# Patient Record
Sex: Female | Born: 1940
Health system: Southern US, Community
[De-identification: ages and names within clinical notes are randomized; demographics above are authoritative.]

## PROBLEM LIST (undated history)

## (undated) DIAGNOSIS — I10 Essential (primary) hypertension: Secondary | ICD-10-CM

## (undated) DIAGNOSIS — E119 Type 2 diabetes mellitus without complications: Secondary | ICD-10-CM

## (undated) DIAGNOSIS — J449 Chronic obstructive pulmonary disease, unspecified: Secondary | ICD-10-CM

## (undated) HISTORY — PX: TONSILLECTOMY: SUR1361

## (undated) HISTORY — DX: Essential (primary) hypertension: I10

## (undated) HISTORY — DX: Type 2 diabetes mellitus without complications: E11.9

## (undated) HISTORY — PX: HERNIA REPAIR: SHX51

## (undated) HISTORY — PX: ABDOMINAL HYSTERECTOMY: SHX81

## (undated) HISTORY — DX: Chronic obstructive pulmonary disease, unspecified: J44.9

---

## 1997-12-03 ENCOUNTER — Encounter: Payer: Self-pay | Admitting: Family Medicine

## 1997-12-03 ENCOUNTER — Ambulatory Visit (HOSPITAL_COMMUNITY): Admission: RE | Admit: 1997-12-03 | Discharge: 1997-12-03 | Payer: Self-pay | Admitting: Family Medicine

## 2000-01-04 ENCOUNTER — Encounter: Payer: Self-pay | Admitting: Family Medicine

## 2000-01-04 ENCOUNTER — Encounter: Admission: RE | Admit: 2000-01-04 | Discharge: 2000-01-04 | Payer: Self-pay | Admitting: Family Medicine

## 2000-01-16 ENCOUNTER — Encounter: Payer: Self-pay | Admitting: Family Medicine

## 2000-01-16 ENCOUNTER — Ambulatory Visit (HOSPITAL_COMMUNITY): Admission: RE | Admit: 2000-01-16 | Discharge: 2000-01-16 | Payer: Self-pay | Admitting: Family Medicine

## 2000-01-22 ENCOUNTER — Encounter: Payer: Self-pay | Admitting: Family Medicine

## 2000-01-22 ENCOUNTER — Encounter: Admission: RE | Admit: 2000-01-22 | Discharge: 2000-01-22 | Payer: Self-pay | Admitting: Family Medicine

## 2000-03-05 ENCOUNTER — Encounter: Admission: RE | Admit: 2000-03-05 | Discharge: 2000-06-03 | Payer: Self-pay | Admitting: Family Medicine

## 2000-03-19 ENCOUNTER — Encounter: Payer: Self-pay | Admitting: Gynecology

## 2000-03-20 ENCOUNTER — Encounter (INDEPENDENT_AMBULATORY_CARE_PROVIDER_SITE_OTHER): Payer: Self-pay | Admitting: Specialist

## 2000-03-20 ENCOUNTER — Observation Stay (HOSPITAL_COMMUNITY): Admission: RE | Admit: 2000-03-20 | Discharge: 2000-03-21 | Payer: Self-pay | Admitting: Gynecology

## 2002-01-27 ENCOUNTER — Other Ambulatory Visit: Admission: RE | Admit: 2002-01-27 | Discharge: 2002-01-27 | Payer: Self-pay | Admitting: Gynecology

## 2002-10-27 ENCOUNTER — Ambulatory Visit (HOSPITAL_COMMUNITY): Admission: RE | Admit: 2002-10-27 | Discharge: 2002-10-27 | Payer: Self-pay | Admitting: Orthopedic Surgery

## 2002-10-27 ENCOUNTER — Encounter: Payer: Self-pay | Admitting: Orthopedic Surgery

## 2002-11-19 ENCOUNTER — Ambulatory Visit (HOSPITAL_COMMUNITY): Admission: RE | Admit: 2002-11-19 | Discharge: 2002-11-19 | Payer: Self-pay | Admitting: Orthopedic Surgery

## 2002-11-19 ENCOUNTER — Ambulatory Visit (HOSPITAL_BASED_OUTPATIENT_CLINIC_OR_DEPARTMENT_OTHER): Admission: RE | Admit: 2002-11-19 | Discharge: 2002-11-19 | Payer: Self-pay | Admitting: Orthopedic Surgery

## 2003-05-24 ENCOUNTER — Ambulatory Visit (HOSPITAL_COMMUNITY): Admission: RE | Admit: 2003-05-24 | Discharge: 2003-05-24 | Payer: Self-pay | Admitting: Family Medicine

## 2009-06-10 ENCOUNTER — Inpatient Hospital Stay (HOSPITAL_COMMUNITY): Admission: RE | Admit: 2009-06-10 | Discharge: 2009-06-11 | Payer: Self-pay | Admitting: Neurosurgery

## 2009-08-04 ENCOUNTER — Encounter: Admission: RE | Admit: 2009-08-04 | Discharge: 2009-08-04 | Payer: Self-pay | Admitting: Neurosurgery

## 2010-03-27 LAB — CBC
HCT: 34.4 % — ABNORMAL LOW (ref 36.0–46.0)
RBC: 4.48 MIL/uL (ref 3.87–5.11)
RDW: 17.1 % — ABNORMAL HIGH (ref 11.5–15.5)
WBC: 7.3 10*3/uL (ref 4.0–10.5)

## 2010-03-27 LAB — TYPE AND SCREEN
ABO/RH(D): A POS
Antibody Screen: NEGATIVE

## 2010-03-27 LAB — DIFFERENTIAL
Basophils Absolute: 0 10*3/uL (ref 0.0–0.1)
Eosinophils Absolute: 0.1 10*3/uL (ref 0.0–0.7)
Eosinophils Relative: 1 % (ref 0–5)
Lymphs Abs: 1.7 10*3/uL (ref 0.7–4.0)
Monocytes Absolute: 0.5 10*3/uL (ref 0.1–1.0)
Monocytes Relative: 8 % (ref 3–12)
Neutro Abs: 4.9 10*3/uL (ref 1.7–7.7)
Neutrophils Relative %: 67 % (ref 43–77)

## 2010-03-27 LAB — ABO/RH: ABO/RH(D): A POS

## 2010-03-27 LAB — GLUCOSE, CAPILLARY: Glucose-Capillary: 228 mg/dL — ABNORMAL HIGH (ref 70–99)

## 2010-05-26 NOTE — Op Note (Signed)
NAME:  Maria Clay, Maria Clay                   ACCOUNT NO.:  [id number]   MEDICAL RECORD NO.:  [id number]                   PATIENT TYPE:  AMB   LOCATION:  DSC                                  FACILITY:  [facility]   PHYSICIAN:  Katy Fitch. Naaman Plummer., M.D.          DATE OF BIRTH:  07-May-1940   DATE OF PROCEDURE:  11/19/2002  DATE OF DISCHARGE:                                 OPERATIVE REPORT   PREOPERATIVE DIAGNOSIS:  Chronic ulnar sided pain left wrist with clinical  signs suggesting a peripheral triangle of fibrocartilage tear, plane films  demonstrating a loose body adjacent to the LT ligament and MRI evidence of  ulnar sided chondromalacia of lunate with complex pathology of the triangle  of fibrocartilage complex including a radial side degenerative tear and an  extensive degenerative tear of the peripheral triangle of fibrocartilage  with synovitis.   POSTOPERATIVE DIAGNOSIS:  Chronic ulnar sided pain left wrist with clinical  signs suggesting a peripheral triangle of fibrocartilage tear, plane films  demonstrating a loose body adjacent to the LT ligament and MRI evidence of  ulnar sided chondromalacia of lunate with complex pathology of the triangle  of fibrocartilage complex including a radial side degenerative tear and an  extensive degenerative tear of the peripheral triangle of fibrocartilage  with synovitis.   OPERATION:  1. Diagnostic arthroscopy left wrist.  2. Arthroscopic debridement of degenerative radial side triangle of     fibrocartilage tear and extensive debridement of degenerative peripheral     triangle of fibrocartilage tear with attempted outside in repair of     peripheral tear which was technically not feasible due to the extensive     degree of degeneration.  Ultimately, we performed an extensive     debridement of chondromalacia of the lunate, removal of loose bodies and     triangle of fibrocartilage debridement with extensive synovectomy.   SURGEON:   Katy Fitch. Sypher, M.D.   ASSISTANT:  Jonni Sanger, P.A.   ANESTHESIA:  General by LMA.   ANESTHESIOLOGIST:  Sheldon Silvan, M.D.   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 Nuala Alpha for evaluation and  management of a painful left wrist.  She had had pain in the wrist since  carrying some heavy bags of yard material in June 2004.  Clinical  examination suggested peripheral triangle of fibrocartilage pathology.  Plane films demonstrated that she was ulnar minus by 1 mm.  She had a small  radio-opaque loose body adjacent to the LT ligament and clinical signs on  examination of a peripheral triangle of fibrocartilage tear.  An  MRI of her  wrist was obtained which documented two tears of the triangle of  fibrocartilage, one a central degenerative tear, the second a peripheral  possibly traumatic tear.  Due to her failure to respond to rest and anti-  inflammatory medications, we recommended diagnostic arthroscopy at this  time.  Given the nature of her degeneration of the triangle of  fibrocartilage, we were uncertain whether or not a repair would be possible.  We recommended debridement at this time with possible repair and possible  ulnar shortening.   PROCEDURE:  Maria Clay is brought to the operating room and placed on  supine position on the operating table.  Following induction of general  anesthesia by LMA, the left arm was prepped with Betadine solution and  sterilely draped.  Following exsanguination of the limb with an Esmarch  bandage, an arterial tourniquet on the proximal brachium was inflated to 220  mmHg.  The procedure commenced with distraction of the wrist with a Tower  designed for wrist arthroscopy.  Finger traps were applied to the index and  long fingers, counter traction on the forearm, 10 pounds of distraction  applied.   The scope was introduced with blunt technique through a  standard portal  after distention of the wrist with sterile saline through a 20 gauge needle.  Diagnostic arthroscopy revealed intact articular surfaces on the scaphoid,  lunate, and triquetrum from the 3/4 view.  There was considerable synovitis  at the site of the peripheral triangle of fibrocartilage, an extensive  degenerative tear with synovitis extruding through the triangle of  fibrocartilage in the ulnar aspect of the dorsal periphery and a small  degenerative radial tear.  A 6R portal was created and suction shaver was  used to debride synovitis and facilitate exposure of the triangle of  fibrocartilage.  The scope was then switched to the 6R portal and an area of  grade 3 chondromalacia on the ulnar aspect of the lunate and degenerative  changes within the L2 ligament were documented.  The radiographic loose body  was not identified, however, the margins of the articular hyaline cartilage  on the lunate were debrided thinking that perhaps the calcifications seen on  x-ray was calcified hyaline cartilage.   The scope was positioned in the 6R portal and a 4/5 portal was used to  further debride the most palmar surface of the triangle of fibrocartilage.  The degenerative tear was smooth.  The peripheral tear was not amenable to  inside out repair with a Tuohy needle.  Therefore, we attempted an outside  in repair utilizing a suture shuttle.  While this was accomplished with some  facility, the angle of the suture placement, in my judgment, would not  satisfactorily re-establish tension in the triangle of fibrocartilage.  Therefore, after several tries, I deemed this technically not satisfactory  and abandoned this procedure.  The degenerative portion of the triangle of  fibrocartilage was thoroughly debrided followed by irrigation of the joint  and removal of the arthroscopic equipment.  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  This is not classic ulnar carpal abutment in  that [name] is not ulnar plus.  In my judgment, thorough debridement  and a period of splinting in supination should allow healing of the triangle  of fibrocartilage to a degree in the peripheral region.   There were no apparent complications.  The portals were closed with Steri-  Strips followed by application of a gauze dressing and a sugar tong splint  maintaining Ms. Pabst's wrist in full supination.  Katy Fitch Naaman Plummer., M.D.    RVS/MEDQ  D:  11/19/2002  T:  11/19/2002  Job:  [id number]   cc:   Nuala Alpha, M.D.

## 2010-05-26 NOTE — Discharge Summary (Signed)
[place]. [facility]  Patient:    Maria Clay, Maria Clay                MRN: [id number] Adm. Date:  [id number] Attending:  Susa Raring                           Discharge Summary  PREOPERATIVE DIAGNOSIS:   Left adnexal mass.  POSTOPERATIVE DIAGNOSIS:  Benign cystic mass of the left ovary.  OPERATION:  Laparoscopic bilateral salpingo-oophorectomy.  SURGEON:  Luvenia Redden, M.D.  ASSISTANT:  Marcelle Overlie, M.D.  ANESTHESIA:  General endotracheal.  DESCRIPTION OF PROCEDURE:   Under good anesthesia, the patient was prepped and draped in a sterile manner.  Foley catheter was placed in the bladder.  Sponge stick was placed in the vagina to identify the upper limits of the vaginal apex.  Transverse incision was made at the lower border of the umbilicus.  The Verres needle was inserted intraperitoneally and pneumoperitoneum formed using carbon dioxide.  Proper placement of the needle was verified by negative pressure reading on the mammometer with upward traction of the abdominal wall before the CO2 was insufflated.  Once the pneumoperitoneum had been established, the needle was removed.  Laparoscopic trocar and cannula was introduced.  The trocar was removed, and the laparoscope introduced.  Under direct vision, puncture sites were identified in both lower quadrants, taking care to miss the major vessels.  A 5 mm trocar and cannula were introduced into the right and left lower quadrants.  The trocar was removed and instruments were inserted through these cannulae.  The bowel was mobilized out of the pelvis under the upper abdomen so that the pelvic organs could be viewed.  The pelvic peritoneum was smooth and shiny.  The right tube and ovary appeared to be normal.  It was freely movable in the pelvis.  The appendix on the right side was normal.  The sigmoid was mobilized into the midline.  The left ovary was enlarged approximately 3-4 cm.  It was  freely movable in the pelvis.  However, there were several excrescences present on the surface of the ovary on its medial surface that were a bit worrisome.  The pelvic peritoneum was smooth and shiny and there was no evidence of any implants. Saline was instilled to collect pelvic washings and these were saved.  The left ovary was attended to first.  The ovary was grasped with a grasper and put on a stretcher.  The infundibulopelvic ligament could be identified readily.  The ureter was identified and was well below the operative site. The infundibulopelvic ligament ligament was grasped with the Seitzinger forceps and was completely desiccated with electrocoagulation and then incised.  Electrocoagulation in the incision was carried down along the ovary separating it from the mesentery down to its caudal portion which was just at the left of the apex of the vagina.  We were able to successfully remove the ovary from its loins without rupturing the cyst.  The ovary and tube on the left side then was placed in a endobag which was introduced through a 10-12 trocar that had been inserted in the midline suprapubically.  The cannula was removed and the fascial incision was extended slightly so that the bag containing the ovary could be removed intact from the peritoneal cavity.  The specimen was sent to pathology for frozen section and examination, and it was reported by Dr. Guilford Shi as being benign cyst  of the left ovary.  The cannula was replaced in the midline incision, and the skin was secured tightly around it with Allis clamps so that the peritoneal cavity could be reinsufflated.  Attention was then directed to the right tube and ovary.  The ovary and tube was grasped with a grasper and the infundibulopelvic ligament put on tension so as to outline it nicely.  The ureter on the right side was viewed beginning at the pelvic brim and all the way down through the pelvis and was well out of  the operative area.  The infundibulopelvic ligament on the right side was then desiccated with electrocoagulation using the Seitzinger forceps and the it was divided.  Dissection was carried out with the coagulating the mesentery and incising it until the ovary had been completely freed up from the pelvis. This ovary was quite small and was removed by grasping it with a single tooth forceps and retracting it up through the 12 mm cannula.  The area was irrigated with saline and this was aspirated.  Operative areas were inspected both under insufflation and under reduced pressure and there was no bleeding at all from the operative sites.  Under the reduce pressure, then the cannula were removed and the puncture sites were viewed internally through the scope.  There was no active bleeding from the puncture sites.  The pneumoperitoneum was completely relieved as much as possible and the scope was then retracted slowly.  There was no bleeding in the subumbilical puncture site either.  The suprapubic incision in the midline was then closed by using a 0 Vicryl continuous suture to close the fascia separately.  Subcutaneous tissue was approximated with a single Vicryl suture, and then the skin was closed with subcuticular 2-0 plain catgut.  The 5 mm puncture sites were closed with a single 2-0 plain catgut suture.  The subumbilical incision was also closed with subcuticular 2-0 plain catgut.  Band-aids were applied.  The Foley catheter was removed as well as removing the sponge sticks from the vagina.  All counts were correct before leaving the operating room.  The patient tolerated the procedure well and was removed to recovery in good condition. DD:  03/20/00 TD:  03/20/00 Job: [id number]

## 2011-07-27 DIAGNOSIS — I493 Ventricular premature depolarization: Secondary | ICD-10-CM

## 2011-07-27 DIAGNOSIS — I10 Essential (primary) hypertension: Secondary | ICD-10-CM

## 2011-07-27 DIAGNOSIS — E782 Mixed hyperlipidemia: Secondary | ICD-10-CM | POA: Insufficient documentation

## 2011-07-27 HISTORY — DX: Mixed hyperlipidemia: E78.2

## 2011-07-27 HISTORY — DX: Essential (primary) hypertension: I10

## 2011-07-27 HISTORY — DX: Ventricular premature depolarization: I49.3

## 2013-07-18 DIAGNOSIS — K589 Irritable bowel syndrome without diarrhea: Secondary | ICD-10-CM | POA: Insufficient documentation

## 2013-07-18 DIAGNOSIS — M1710 Unilateral primary osteoarthritis, unspecified knee: Secondary | ICD-10-CM | POA: Insufficient documentation

## 2013-07-18 DIAGNOSIS — R5383 Other fatigue: Secondary | ICD-10-CM | POA: Insufficient documentation

## 2013-07-18 DIAGNOSIS — K222 Esophageal obstruction: Secondary | ICD-10-CM

## 2013-07-18 DIAGNOSIS — K257 Chronic gastric ulcer without hemorrhage or perforation: Secondary | ICD-10-CM

## 2013-07-18 DIAGNOSIS — K449 Diaphragmatic hernia without obstruction or gangrene: Secondary | ICD-10-CM

## 2013-07-18 DIAGNOSIS — G2581 Restless legs syndrome: Secondary | ICD-10-CM | POA: Insufficient documentation

## 2013-07-18 DIAGNOSIS — K3189 Other diseases of stomach and duodenum: Secondary | ICD-10-CM | POA: Insufficient documentation

## 2013-07-18 DIAGNOSIS — I059 Rheumatic mitral valve disease, unspecified: Secondary | ICD-10-CM

## 2013-07-18 DIAGNOSIS — J449 Chronic obstructive pulmonary disease, unspecified: Secondary | ICD-10-CM | POA: Insufficient documentation

## 2013-07-18 DIAGNOSIS — E538 Deficiency of other specified B group vitamins: Secondary | ICD-10-CM

## 2013-07-18 DIAGNOSIS — M810 Age-related osteoporosis without current pathological fracture: Secondary | ICD-10-CM | POA: Insufficient documentation

## 2013-07-18 DIAGNOSIS — K2960 Other gastritis without bleeding: Secondary | ICD-10-CM | POA: Insufficient documentation

## 2013-07-18 DIAGNOSIS — M19039 Primary osteoarthritis, unspecified wrist: Secondary | ICD-10-CM | POA: Insufficient documentation

## 2013-07-18 DIAGNOSIS — N3281 Overactive bladder: Secondary | ICD-10-CM | POA: Insufficient documentation

## 2013-07-18 DIAGNOSIS — I459 Conduction disorder, unspecified: Secondary | ICD-10-CM

## 2013-07-18 DIAGNOSIS — E669 Obesity, unspecified: Secondary | ICD-10-CM | POA: Insufficient documentation

## 2013-07-18 DIAGNOSIS — D509 Iron deficiency anemia, unspecified: Secondary | ICD-10-CM

## 2013-07-18 HISTORY — DX: Chronic obstructive pulmonary disease, unspecified: J44.9

## 2013-07-18 HISTORY — DX: Chronic gastric ulcer without hemorrhage or perforation: K25.7

## 2013-07-18 HISTORY — DX: Irritable bowel syndrome, unspecified: K58.9

## 2013-07-18 HISTORY — DX: Esophageal obstruction: K22.2

## 2013-07-18 HISTORY — DX: Unilateral primary osteoarthritis, unspecified knee: M17.10

## 2013-07-18 HISTORY — DX: Primary osteoarthritis, unspecified wrist: M19.039

## 2013-07-18 HISTORY — DX: Overactive bladder: N32.81

## 2013-07-18 HISTORY — DX: Age-related osteoporosis without current pathological fracture: M81.0

## 2013-07-18 HISTORY — DX: Conduction disorder, unspecified: I45.9

## 2013-07-18 HISTORY — DX: Other gastritis without bleeding: K29.60

## 2013-07-18 HISTORY — DX: Diaphragmatic hernia without obstruction or gangrene: K44.9

## 2013-07-18 HISTORY — DX: Obesity, unspecified: E66.9

## 2013-07-18 HISTORY — DX: Other diseases of stomach and duodenum: K31.89

## 2013-07-18 HISTORY — DX: Iron deficiency anemia, unspecified: D50.9

## 2013-07-18 HISTORY — DX: Restless legs syndrome: G25.81

## 2013-07-18 HISTORY — DX: Deficiency of other specified B group vitamins: E53.8

## 2013-07-18 HISTORY — DX: Rheumatic mitral valve disease, unspecified: I05.9

## 2013-07-18 HISTORY — DX: Other fatigue: R53.83

## 2013-07-22 ENCOUNTER — Encounter: Payer: Self-pay | Admitting: Internal Medicine

## 2014-04-11 ENCOUNTER — Encounter: Payer: Self-pay | Admitting: Internal Medicine

## 2014-04-19 DIAGNOSIS — Z794 Long term (current) use of insulin: Secondary | ICD-10-CM | POA: Insufficient documentation

## 2015-05-04 DIAGNOSIS — K219 Gastro-esophageal reflux disease without esophagitis: Secondary | ICD-10-CM

## 2015-05-04 DIAGNOSIS — J309 Allergic rhinitis, unspecified: Secondary | ICD-10-CM

## 2015-05-04 HISTORY — DX: Allergic rhinitis, unspecified: J30.9

## 2015-05-04 HISTORY — DX: Gastro-esophageal reflux disease without esophagitis: K21.9

## 2015-11-25 DIAGNOSIS — J984 Other disorders of lung: Secondary | ICD-10-CM | POA: Insufficient documentation

## 2015-11-25 HISTORY — DX: Other disorders of lung: J98.4

## 2016-05-24 DIAGNOSIS — R059 Cough, unspecified: Secondary | ICD-10-CM | POA: Insufficient documentation

## 2016-05-24 DIAGNOSIS — R1319 Other dysphagia: Secondary | ICD-10-CM

## 2016-05-24 HISTORY — DX: Other dysphagia: R13.19

## 2016-09-20 DIAGNOSIS — H25813 Combined forms of age-related cataract, bilateral: Secondary | ICD-10-CM | POA: Insufficient documentation

## 2016-09-20 HISTORY — DX: Combined forms of age-related cataract, bilateral: H25.813

## 2017-01-25 DIAGNOSIS — F410 Panic disorder [episodic paroxysmal anxiety] without agoraphobia: Secondary | ICD-10-CM

## 2017-01-25 HISTORY — DX: Panic disorder (episodic paroxysmal anxiety): F41.0

## 2017-04-30 DIAGNOSIS — M47816 Spondylosis without myelopathy or radiculopathy, lumbar region: Secondary | ICD-10-CM | POA: Insufficient documentation

## 2017-04-30 DIAGNOSIS — M7062 Trochanteric bursitis, left hip: Secondary | ICD-10-CM | POA: Insufficient documentation

## 2017-04-30 HISTORY — DX: Spondylosis without myelopathy or radiculopathy, lumbar region: M47.816

## 2017-04-30 HISTORY — DX: Trochanteric bursitis, left hip: M70.62

## 2017-09-02 DIAGNOSIS — M75122 Complete rotator cuff tear or rupture of left shoulder, not specified as traumatic: Secondary | ICD-10-CM | POA: Insufficient documentation

## 2017-09-02 HISTORY — DX: Complete rotator cuff tear or rupture of left shoulder, not specified as traumatic: M75.122

## 2017-09-17 DIAGNOSIS — F4000 Agoraphobia, unspecified: Secondary | ICD-10-CM | POA: Insufficient documentation

## 2017-09-17 HISTORY — DX: Agoraphobia, unspecified: F40.00

## 2018-08-14 DIAGNOSIS — M6208 Separation of muscle (nontraumatic), other site: Secondary | ICD-10-CM

## 2018-08-14 DIAGNOSIS — K3184 Gastroparesis: Secondary | ICD-10-CM | POA: Insufficient documentation

## 2018-08-14 DIAGNOSIS — R140 Abdominal distension (gaseous): Secondary | ICD-10-CM

## 2018-08-14 HISTORY — DX: Gastroparesis: K31.84

## 2018-08-14 HISTORY — DX: Abdominal distension (gaseous): R14.0

## 2018-08-14 HISTORY — DX: Separation of muscle (nontraumatic), other site: M62.08

## 2018-12-30 DIAGNOSIS — H02403 Unspecified ptosis of bilateral eyelids: Secondary | ICD-10-CM | POA: Insufficient documentation

## 2018-12-30 HISTORY — DX: Unspecified ptosis of bilateral eyelids: H02.403

## 2019-01-12 DIAGNOSIS — M7552 Bursitis of left shoulder: Secondary | ICD-10-CM | POA: Diagnosis not present

## 2019-01-12 DIAGNOSIS — M7582 Other shoulder lesions, left shoulder: Secondary | ICD-10-CM | POA: Diagnosis not present

## 2019-01-12 DIAGNOSIS — K219 Gastro-esophageal reflux disease without esophagitis: Secondary | ICD-10-CM | POA: Diagnosis not present

## 2019-01-12 DIAGNOSIS — E1165 Type 2 diabetes mellitus with hyperglycemia: Secondary | ICD-10-CM | POA: Diagnosis not present

## 2019-01-12 DIAGNOSIS — M75112 Incomplete rotator cuff tear or rupture of left shoulder, not specified as traumatic: Secondary | ICD-10-CM | POA: Diagnosis not present

## 2019-01-12 DIAGNOSIS — M7522 Bicipital tendinitis, left shoulder: Secondary | ICD-10-CM | POA: Diagnosis not present

## 2019-01-12 DIAGNOSIS — M75122 Complete rotator cuff tear or rupture of left shoulder, not specified as traumatic: Secondary | ICD-10-CM | POA: Diagnosis not present

## 2019-01-12 DIAGNOSIS — G8918 Other acute postprocedural pain: Secondary | ICD-10-CM | POA: Diagnosis not present

## 2019-01-12 DIAGNOSIS — Z794 Long term (current) use of insulin: Secondary | ICD-10-CM | POA: Diagnosis not present

## 2019-01-21 DIAGNOSIS — M75122 Complete rotator cuff tear or rupture of left shoulder, not specified as traumatic: Secondary | ICD-10-CM | POA: Diagnosis not present

## 2019-01-21 DIAGNOSIS — Z9889 Other specified postprocedural states: Secondary | ICD-10-CM | POA: Diagnosis not present

## 2019-01-21 DIAGNOSIS — R29898 Other symptoms and signs involving the musculoskeletal system: Secondary | ICD-10-CM | POA: Diagnosis not present

## 2019-01-21 DIAGNOSIS — M25612 Stiffness of left shoulder, not elsewhere classified: Secondary | ICD-10-CM | POA: Diagnosis not present

## 2019-01-21 DIAGNOSIS — R6889 Other general symptoms and signs: Secondary | ICD-10-CM | POA: Diagnosis not present

## 2019-02-04 DIAGNOSIS — Z9889 Other specified postprocedural states: Secondary | ICD-10-CM | POA: Diagnosis not present

## 2019-02-04 DIAGNOSIS — M25612 Stiffness of left shoulder, not elsewhere classified: Secondary | ICD-10-CM | POA: Diagnosis not present

## 2019-02-04 DIAGNOSIS — R6889 Other general symptoms and signs: Secondary | ICD-10-CM | POA: Diagnosis not present

## 2019-02-04 DIAGNOSIS — R29898 Other symptoms and signs involving the musculoskeletal system: Secondary | ICD-10-CM | POA: Diagnosis not present

## 2019-02-04 DIAGNOSIS — M75122 Complete rotator cuff tear or rupture of left shoulder, not specified as traumatic: Secondary | ICD-10-CM | POA: Diagnosis not present

## 2019-02-06 DIAGNOSIS — R300 Dysuria: Secondary | ICD-10-CM | POA: Diagnosis not present

## 2019-02-11 DIAGNOSIS — Z9889 Other specified postprocedural states: Secondary | ICD-10-CM | POA: Diagnosis not present

## 2019-02-11 DIAGNOSIS — M75122 Complete rotator cuff tear or rupture of left shoulder, not specified as traumatic: Secondary | ICD-10-CM | POA: Diagnosis not present

## 2019-02-11 DIAGNOSIS — R29898 Other symptoms and signs involving the musculoskeletal system: Secondary | ICD-10-CM | POA: Diagnosis not present

## 2019-02-11 DIAGNOSIS — R6889 Other general symptoms and signs: Secondary | ICD-10-CM | POA: Diagnosis not present

## 2019-02-11 DIAGNOSIS — M25612 Stiffness of left shoulder, not elsewhere classified: Secondary | ICD-10-CM | POA: Diagnosis not present

## 2019-02-12 DIAGNOSIS — N3020 Other chronic cystitis without hematuria: Secondary | ICD-10-CM | POA: Diagnosis not present

## 2019-02-16 DIAGNOSIS — M25612 Stiffness of left shoulder, not elsewhere classified: Secondary | ICD-10-CM | POA: Diagnosis not present

## 2019-02-16 DIAGNOSIS — Z9889 Other specified postprocedural states: Secondary | ICD-10-CM | POA: Diagnosis not present

## 2019-02-16 DIAGNOSIS — R29898 Other symptoms and signs involving the musculoskeletal system: Secondary | ICD-10-CM | POA: Diagnosis not present

## 2019-02-16 DIAGNOSIS — R6889 Other general symptoms and signs: Secondary | ICD-10-CM | POA: Diagnosis not present

## 2019-02-18 DIAGNOSIS — M25612 Stiffness of left shoulder, not elsewhere classified: Secondary | ICD-10-CM | POA: Diagnosis not present

## 2019-02-18 DIAGNOSIS — R6889 Other general symptoms and signs: Secondary | ICD-10-CM | POA: Diagnosis not present

## 2019-02-18 DIAGNOSIS — R29898 Other symptoms and signs involving the musculoskeletal system: Secondary | ICD-10-CM | POA: Diagnosis not present

## 2019-02-18 DIAGNOSIS — Z9889 Other specified postprocedural states: Secondary | ICD-10-CM | POA: Diagnosis not present

## 2019-02-25 DIAGNOSIS — Z9889 Other specified postprocedural states: Secondary | ICD-10-CM | POA: Diagnosis not present

## 2019-02-25 DIAGNOSIS — M25612 Stiffness of left shoulder, not elsewhere classified: Secondary | ICD-10-CM | POA: Diagnosis not present

## 2019-02-25 DIAGNOSIS — R29898 Other symptoms and signs involving the musculoskeletal system: Secondary | ICD-10-CM | POA: Diagnosis not present

## 2019-02-25 DIAGNOSIS — R6889 Other general symptoms and signs: Secondary | ICD-10-CM | POA: Diagnosis not present

## 2019-03-02 DIAGNOSIS — Z9889 Other specified postprocedural states: Secondary | ICD-10-CM | POA: Diagnosis not present

## 2019-03-02 DIAGNOSIS — M25612 Stiffness of left shoulder, not elsewhere classified: Secondary | ICD-10-CM | POA: Diagnosis not present

## 2019-03-02 DIAGNOSIS — R6889 Other general symptoms and signs: Secondary | ICD-10-CM | POA: Diagnosis not present

## 2019-03-02 DIAGNOSIS — R29898 Other symptoms and signs involving the musculoskeletal system: Secondary | ICD-10-CM | POA: Diagnosis not present

## 2019-03-02 DIAGNOSIS — M25512 Pain in left shoulder: Secondary | ICD-10-CM | POA: Diagnosis not present

## 2019-03-04 DIAGNOSIS — Z9889 Other specified postprocedural states: Secondary | ICD-10-CM | POA: Diagnosis not present

## 2019-03-04 DIAGNOSIS — R6889 Other general symptoms and signs: Secondary | ICD-10-CM | POA: Diagnosis not present

## 2019-03-04 DIAGNOSIS — M25612 Stiffness of left shoulder, not elsewhere classified: Secondary | ICD-10-CM | POA: Diagnosis not present

## 2019-03-04 DIAGNOSIS — R29898 Other symptoms and signs involving the musculoskeletal system: Secondary | ICD-10-CM | POA: Diagnosis not present

## 2019-03-09 DIAGNOSIS — E119 Type 2 diabetes mellitus without complications: Secondary | ICD-10-CM | POA: Diagnosis not present

## 2019-03-09 DIAGNOSIS — E782 Mixed hyperlipidemia: Secondary | ICD-10-CM | POA: Diagnosis not present

## 2019-03-09 DIAGNOSIS — K219 Gastro-esophageal reflux disease without esophagitis: Secondary | ICD-10-CM | POA: Diagnosis not present

## 2019-03-09 DIAGNOSIS — I10 Essential (primary) hypertension: Secondary | ICD-10-CM | POA: Diagnosis not present

## 2019-03-09 DIAGNOSIS — Z794 Long term (current) use of insulin: Secondary | ICD-10-CM | POA: Diagnosis not present

## 2019-03-16 DIAGNOSIS — R29898 Other symptoms and signs involving the musculoskeletal system: Secondary | ICD-10-CM | POA: Diagnosis not present

## 2019-03-16 DIAGNOSIS — R6889 Other general symptoms and signs: Secondary | ICD-10-CM | POA: Diagnosis not present

## 2019-03-16 DIAGNOSIS — Z9889 Other specified postprocedural states: Secondary | ICD-10-CM | POA: Diagnosis not present

## 2019-03-16 DIAGNOSIS — M75122 Complete rotator cuff tear or rupture of left shoulder, not specified as traumatic: Secondary | ICD-10-CM | POA: Diagnosis not present

## 2019-03-16 DIAGNOSIS — M25612 Stiffness of left shoulder, not elsewhere classified: Secondary | ICD-10-CM | POA: Diagnosis not present

## 2019-03-16 DIAGNOSIS — M25512 Pain in left shoulder: Secondary | ICD-10-CM | POA: Diagnosis not present

## 2019-03-18 DIAGNOSIS — R6889 Other general symptoms and signs: Secondary | ICD-10-CM | POA: Diagnosis not present

## 2019-03-18 DIAGNOSIS — R29898 Other symptoms and signs involving the musculoskeletal system: Secondary | ICD-10-CM | POA: Diagnosis not present

## 2019-03-18 DIAGNOSIS — Z9889 Other specified postprocedural states: Secondary | ICD-10-CM | POA: Diagnosis not present

## 2019-03-18 DIAGNOSIS — M25612 Stiffness of left shoulder, not elsewhere classified: Secondary | ICD-10-CM | POA: Diagnosis not present

## 2019-03-25 DIAGNOSIS — Z9889 Other specified postprocedural states: Secondary | ICD-10-CM | POA: Diagnosis not present

## 2019-03-25 DIAGNOSIS — R6889 Other general symptoms and signs: Secondary | ICD-10-CM | POA: Diagnosis not present

## 2019-03-25 DIAGNOSIS — R29898 Other symptoms and signs involving the musculoskeletal system: Secondary | ICD-10-CM | POA: Diagnosis not present

## 2019-03-25 DIAGNOSIS — M25612 Stiffness of left shoulder, not elsewhere classified: Secondary | ICD-10-CM | POA: Diagnosis not present

## 2019-03-30 DIAGNOSIS — R29898 Other symptoms and signs involving the musculoskeletal system: Secondary | ICD-10-CM | POA: Diagnosis not present

## 2019-03-30 DIAGNOSIS — M25612 Stiffness of left shoulder, not elsewhere classified: Secondary | ICD-10-CM | POA: Diagnosis not present

## 2019-03-30 DIAGNOSIS — M75122 Complete rotator cuff tear or rupture of left shoulder, not specified as traumatic: Secondary | ICD-10-CM | POA: Diagnosis not present

## 2019-03-30 DIAGNOSIS — Z9889 Other specified postprocedural states: Secondary | ICD-10-CM | POA: Diagnosis not present

## 2019-03-30 DIAGNOSIS — R6889 Other general symptoms and signs: Secondary | ICD-10-CM | POA: Diagnosis not present

## 2019-03-31 DIAGNOSIS — J449 Chronic obstructive pulmonary disease, unspecified: Secondary | ICD-10-CM | POA: Diagnosis not present

## 2019-03-31 DIAGNOSIS — R1310 Dysphagia, unspecified: Secondary | ICD-10-CM | POA: Diagnosis not present

## 2019-03-31 DIAGNOSIS — R918 Other nonspecific abnormal finding of lung field: Secondary | ICD-10-CM | POA: Diagnosis not present

## 2019-03-31 DIAGNOSIS — J984 Other disorders of lung: Secondary | ICD-10-CM | POA: Diagnosis not present

## 2019-04-06 DIAGNOSIS — M25612 Stiffness of left shoulder, not elsewhere classified: Secondary | ICD-10-CM | POA: Diagnosis not present

## 2019-04-06 DIAGNOSIS — Z9889 Other specified postprocedural states: Secondary | ICD-10-CM | POA: Diagnosis not present

## 2019-04-06 DIAGNOSIS — R6889 Other general symptoms and signs: Secondary | ICD-10-CM | POA: Diagnosis not present

## 2019-04-06 DIAGNOSIS — M75122 Complete rotator cuff tear or rupture of left shoulder, not specified as traumatic: Secondary | ICD-10-CM | POA: Diagnosis not present

## 2019-04-06 DIAGNOSIS — R29898 Other symptoms and signs involving the musculoskeletal system: Secondary | ICD-10-CM | POA: Diagnosis not present

## 2019-04-07 DIAGNOSIS — G8929 Other chronic pain: Secondary | ICD-10-CM | POA: Diagnosis not present

## 2019-04-07 DIAGNOSIS — M25512 Pain in left shoulder: Secondary | ICD-10-CM | POA: Diagnosis not present

## 2019-04-13 DIAGNOSIS — R6889 Other general symptoms and signs: Secondary | ICD-10-CM | POA: Diagnosis not present

## 2019-04-13 DIAGNOSIS — R29898 Other symptoms and signs involving the musculoskeletal system: Secondary | ICD-10-CM | POA: Diagnosis not present

## 2019-04-13 DIAGNOSIS — M25612 Stiffness of left shoulder, not elsewhere classified: Secondary | ICD-10-CM | POA: Diagnosis not present

## 2019-04-13 DIAGNOSIS — Z9889 Other specified postprocedural states: Secondary | ICD-10-CM | POA: Diagnosis not present

## 2019-04-13 DIAGNOSIS — M25512 Pain in left shoulder: Secondary | ICD-10-CM | POA: Diagnosis not present

## 2019-04-27 DIAGNOSIS — Z9889 Other specified postprocedural states: Secondary | ICD-10-CM | POA: Diagnosis not present

## 2019-04-27 DIAGNOSIS — M25512 Pain in left shoulder: Secondary | ICD-10-CM | POA: Diagnosis not present

## 2019-04-27 DIAGNOSIS — M25612 Stiffness of left shoulder, not elsewhere classified: Secondary | ICD-10-CM | POA: Diagnosis not present

## 2019-04-27 DIAGNOSIS — R6889 Other general symptoms and signs: Secondary | ICD-10-CM | POA: Diagnosis not present

## 2019-04-27 DIAGNOSIS — R29898 Other symptoms and signs involving the musculoskeletal system: Secondary | ICD-10-CM | POA: Diagnosis not present

## 2019-05-04 DIAGNOSIS — M25612 Stiffness of left shoulder, not elsewhere classified: Secondary | ICD-10-CM | POA: Diagnosis not present

## 2019-05-04 DIAGNOSIS — R29898 Other symptoms and signs involving the musculoskeletal system: Secondary | ICD-10-CM | POA: Diagnosis not present

## 2019-05-04 DIAGNOSIS — M25512 Pain in left shoulder: Secondary | ICD-10-CM | POA: Diagnosis not present

## 2019-05-04 DIAGNOSIS — Z9889 Other specified postprocedural states: Secondary | ICD-10-CM | POA: Diagnosis not present

## 2019-05-04 DIAGNOSIS — R6889 Other general symptoms and signs: Secondary | ICD-10-CM | POA: Diagnosis not present

## 2019-05-12 DIAGNOSIS — E538 Deficiency of other specified B group vitamins: Secondary | ICD-10-CM | POA: Diagnosis not present

## 2019-05-18 DIAGNOSIS — Z09 Encounter for follow-up examination after completed treatment for conditions other than malignant neoplasm: Secondary | ICD-10-CM | POA: Diagnosis not present

## 2019-05-26 DIAGNOSIS — E119 Type 2 diabetes mellitus without complications: Secondary | ICD-10-CM | POA: Diagnosis not present

## 2019-05-26 DIAGNOSIS — Z794 Long term (current) use of insulin: Secondary | ICD-10-CM | POA: Diagnosis not present

## 2019-05-26 DIAGNOSIS — I10 Essential (primary) hypertension: Secondary | ICD-10-CM | POA: Diagnosis not present

## 2019-06-26 DIAGNOSIS — E119 Type 2 diabetes mellitus without complications: Secondary | ICD-10-CM | POA: Diagnosis not present

## 2019-06-26 DIAGNOSIS — D649 Anemia, unspecified: Secondary | ICD-10-CM | POA: Diagnosis not present

## 2019-06-26 DIAGNOSIS — Z794 Long term (current) use of insulin: Secondary | ICD-10-CM | POA: Diagnosis not present

## 2019-06-26 DIAGNOSIS — R5383 Other fatigue: Secondary | ICD-10-CM | POA: Diagnosis not present

## 2019-06-26 DIAGNOSIS — D509 Iron deficiency anemia, unspecified: Secondary | ICD-10-CM | POA: Diagnosis not present

## 2019-07-03 DIAGNOSIS — Z1231 Encounter for screening mammogram for malignant neoplasm of breast: Secondary | ICD-10-CM | POA: Diagnosis not present

## 2019-07-06 DIAGNOSIS — R300 Dysuria: Secondary | ICD-10-CM | POA: Diagnosis not present

## 2019-07-23 DIAGNOSIS — R300 Dysuria: Secondary | ICD-10-CM | POA: Diagnosis not present

## 2019-08-26 DIAGNOSIS — R296 Repeated falls: Secondary | ICD-10-CM | POA: Diagnosis not present

## 2019-08-26 DIAGNOSIS — R413 Other amnesia: Secondary | ICD-10-CM | POA: Diagnosis not present

## 2019-08-26 DIAGNOSIS — Z0000 Encounter for general adult medical examination without abnormal findings: Secondary | ICD-10-CM | POA: Diagnosis not present

## 2019-08-26 DIAGNOSIS — I10 Essential (primary) hypertension: Secondary | ICD-10-CM | POA: Diagnosis not present

## 2019-10-27 DIAGNOSIS — K219 Gastro-esophageal reflux disease without esophagitis: Secondary | ICD-10-CM | POA: Diagnosis not present

## 2019-10-27 DIAGNOSIS — K449 Diaphragmatic hernia without obstruction or gangrene: Secondary | ICD-10-CM | POA: Diagnosis not present

## 2019-10-27 DIAGNOSIS — K3184 Gastroparesis: Secondary | ICD-10-CM | POA: Diagnosis not present

## 2019-10-27 DIAGNOSIS — K589 Irritable bowel syndrome without diarrhea: Secondary | ICD-10-CM | POA: Diagnosis not present

## 2019-11-16 DIAGNOSIS — E782 Mixed hyperlipidemia: Secondary | ICD-10-CM | POA: Diagnosis not present

## 2019-11-16 DIAGNOSIS — Z794 Long term (current) use of insulin: Secondary | ICD-10-CM | POA: Diagnosis not present

## 2019-11-16 DIAGNOSIS — E1165 Type 2 diabetes mellitus with hyperglycemia: Secondary | ICD-10-CM | POA: Diagnosis not present

## 2019-11-16 DIAGNOSIS — I10 Essential (primary) hypertension: Secondary | ICD-10-CM | POA: Diagnosis not present

## 2019-11-25 DIAGNOSIS — J301 Allergic rhinitis due to pollen: Secondary | ICD-10-CM | POA: Diagnosis not present

## 2019-11-25 DIAGNOSIS — Z961 Presence of intraocular lens: Secondary | ICD-10-CM | POA: Diagnosis not present

## 2019-11-25 DIAGNOSIS — E119 Type 2 diabetes mellitus without complications: Secondary | ICD-10-CM | POA: Diagnosis not present

## 2019-11-25 DIAGNOSIS — H26492 Other secondary cataract, left eye: Secondary | ICD-10-CM | POA: Diagnosis not present

## 2019-11-27 DIAGNOSIS — H26492 Other secondary cataract, left eye: Secondary | ICD-10-CM | POA: Diagnosis not present

## 2020-01-05 ENCOUNTER — Encounter (HOSPITAL_BASED_OUTPATIENT_CLINIC_OR_DEPARTMENT_OTHER): Payer: Self-pay | Admitting: *Deleted

## 2020-01-05 ENCOUNTER — Other Ambulatory Visit: Payer: Self-pay

## 2020-01-05 ENCOUNTER — Emergency Department (HOSPITAL_BASED_OUTPATIENT_CLINIC_OR_DEPARTMENT_OTHER): Payer: PPO

## 2020-01-05 ENCOUNTER — Emergency Department (HOSPITAL_BASED_OUTPATIENT_CLINIC_OR_DEPARTMENT_OTHER)
Admission: EM | Admit: 2020-01-05 | Discharge: 2020-01-05 | Disposition: A | Discharge status: Home / Self Care | Payer: PPO | Attending: Emergency Medicine | Admitting: Emergency Medicine

## 2020-01-05 DIAGNOSIS — R519 Headache, unspecified: Secondary | ICD-10-CM | POA: Diagnosis not present

## 2020-01-05 DIAGNOSIS — J449 Chronic obstructive pulmonary disease, unspecified: Secondary | ICD-10-CM | POA: Insufficient documentation

## 2020-01-05 DIAGNOSIS — I10 Essential (primary) hypertension: Secondary | ICD-10-CM | POA: Diagnosis not present

## 2020-01-05 DIAGNOSIS — Z87891 Personal history of nicotine dependence: Secondary | ICD-10-CM | POA: Insufficient documentation

## 2020-01-05 DIAGNOSIS — E119 Type 2 diabetes mellitus without complications: Secondary | ICD-10-CM | POA: Diagnosis not present

## 2020-01-05 MED ORDER — LIDOCAINE 5 % EX PTCH: 1.0000 | MEDICATED_PATCH | Freq: Every day | CUTANEOUS | 0 refills | Status: DC | PRN

## 2020-01-05 MED ORDER — LIDOCAINE 5 % EX PTCH
1.0000 | MEDICATED_PATCH | CUTANEOUS | Status: DC
  Administered 2020-01-05: 1 via TRANSDERMAL
  Filled 2020-01-05: qty 1

## 2020-01-05 NOTE — ED Triage Notes (Signed)
Left lateral head pain for 6 months

## 2020-01-05 NOTE — ED Provider Notes (Signed)
[facility] EMERGENCY DEPARTMENT Provider Note   CSN: [id number] Arrival date & time: 01/05/20  3295     History Chief Complaint  Patient presents with  . Headache    Maria Clay is a 79 y.o. female with a hx of diabetes mellitus, hypertension, & COPD who presents to the ED with complaints of headaches that have been occurring over the past 6 months.  Patient states that her headaches occur in the left posterior aspect of her head, they have gradual onset with steady progression, and are aching in nature.  They are alleviated with ibuprofen and aspirin somewhat.  She states that they began 6 months ago after she fell and hit the right side of her face, but were fairly minimal and infrequent, have increased in severity and frequency especially over the past 2 weeks they have become constant.  Prior to 6 months ago she had never had a headache like this before.  She denies fever, chills, visual disturbance, numbness, tingling, weakness, rashes, jaw pain, or joint stiffness that is new.  HPI     Past Medical History:  Diagnosis Date  . COPD (chronic obstructive pulmonary disease) (HCC)   . Diabetes mellitus without complication (HCC)   . Hypertension     There are no problems to display for this patient.   Past Surgical History:  Procedure Laterality Date  . ABDOMINAL HYSTERECTOMY    . HERNIA REPAIR    . TONSILLECTOMY       OB History    Gravida  3   Para      Term      Preterm      AB  1   Living        SAB      IAB      Ectopic      Multiple  1   Live Births              No family history on file.  Social History   Tobacco Use  . Smoking status: Former Games developer  . Smokeless tobacco: Never Used  . Tobacco comment: quit 24 years ago  Substance Use Topics  . Alcohol use: Yes    Comment: occasionally  . Drug use: Never    Home Medications Prior to Admission medications   Not on File    Allergies    Codeine  Review of  Systems   Review of Systems  Constitutional: Negative for chills and fever.  Eyes: Negative for visual disturbance.  Respiratory: Negative for shortness of breath.   Cardiovascular: Negative for chest pain.  Gastrointestinal: Negative for abdominal pain, nausea and vomiting.  Neurological: Positive for headaches. Negative for dizziness, syncope, speech difficulty, weakness and numbness.  All other systems reviewed and are negative.   Physical Exam Updated Vital Signs BP (!) 176/87 (BP Location: Left Arm)   Pulse 81   Temp 98.4 F (36.9 C) (Oral)   Resp 16   Ht 5' 4.5" (1.638 m)   Wt 79.4 kg   SpO2 98%   BMI 29.57 kg/m   Physical Exam Vitals and nursing note reviewed.  Constitutional:      General: She is not in acute distress.    Appearance: She is well-developed. She is not toxic-appearing.  HENT:     Head: Normocephalic and atraumatic.     Comments: No tenderness or swelling noted to the temporal artery region.  No scalp lesions or rashes. Eyes:     General: Vision  grossly intact. Gaze aligned appropriately.        Right eye: No discharge.        Left eye: No discharge.     Extraocular Movements: Extraocular movements intact.     Conjunctiva/sclera: Conjunctivae normal.     Comments: PERRL. No proptosis.   Cardiovascular:     Rate and Rhythm: Normal rate and regular rhythm.  Pulmonary:     Effort: Pulmonary effort is normal. No respiratory distress.     Breath sounds: Normal breath sounds. No wheezing, rhonchi or rales.  Abdominal:     General: There is no distension.     Palpations: Abdomen is soft.     Tenderness: There is no abdominal tenderness.  Musculoskeletal:     Cervical back: Normal range of motion and neck supple. No rigidity. Muscular tenderness (left) present. No spinous process tenderness.  Skin:    General: Skin is warm and dry.     Findings: No rash.  Neurological:     Comments: Alert. Clear speech. No facial droop. CNIII-XII grossly intact.  Bilateral upper and lower extremities' sensation grossly intact. 5/5 symmetric strength with grip strength and with plantar and dorsi flexion bilaterally . Normal finger to nose bilaterally. Negative pronator drift. Negative Romberg sign. Gait is steady and intact.   Psychiatric:        Behavior: Behavior normal.    ED Results / Procedures / Treatments   Labs (all labs ordered are listed, but only abnormal results are displayed) Labs Reviewed - No data to display  EKG None  Radiology CT Head Wo Contrast  Result Date: 01/05/2020 CLINICAL DATA:  Headache. EXAM: CT HEAD WITHOUT CONTRAST TECHNIQUE: Contiguous axial images were obtained from the base of the skull through the vertex without intravenous contrast. COMPARISON:  None. FINDINGS: Brain: No evidence of acute infarction, hemorrhage, hydrocephalus, extra-axial collection or mass lesion/mass effect. There is mild diffuse low-attenuation within the subcortical and periventricular white matter compatible with chronic microvascular disease. Prominence of sulci and ventricles compatible with brain atrophy. Vascular: No hyperdense vessel or unexpected calcification. Skull: Normal. Negative for fracture or focal lesion. Sinuses/Orbits: No acute finding. Other: None. IMPRESSION: 1. No acute intracranial abnormalities. 2. Chronic small vessel ischemic change and brain atrophy. Electronically Signed   By: Signa Kell M.D.   On: 01/05/2020 12:25    Procedures Procedures (including critical care time)  Medications Ordered in ED Medications - No data to display  ED Course  I have reviewed the triage vital signs and the nursing notes.  Pertinent labs & imaging results that were available during my care of the patient were reviewed by me and considered in my medical decision making (see chart for details).    MDM Rules/Calculators/A&P                          Patient presents to the emergency department with complaints of progressively  worsening intermittent and now constant headaches over the past 6 months.  She is nontoxic, resting comfortably, her vitals are notable for elevated blood pressure, she states that she did not take her blood pressure medication today, low suspicion for hypertensive emergency at this time.  Given patient is 79 years old with new headaches over the past 6 months I have ordered a head CT, personally reviewed and interpreted, agree with radiologist read 1. No acute intracranial abnormalities. 2. Chronic small vessel ischemic change and brain atrophy.  No bleed or large masses noted on  CT scan.  Headaches do not have sudden onset, occurring intermittently for months, I have a low suspicion for subarachnoid hemorrhage.  Patient is afebrile without nuchal rigidity doubt meningitis.  She has no temporal artery tenderness to palpation, visual disturbance, or jaw pain, doubt temporal arteritis or acute angle closure glaucoma. No focal neuro deficits to suggest ischemic CVA. Given duration of symptoms low suspicion for vertebral artery dissection.  Patient is feeling a bit better following application of Lidoderm patch.  Will discharge with a prescription for these.  PCP follow-up. I discussed results, treatment plan, need for follow-up, and return precautions with the patient. Provided opportunity for questions, patient confirmed understanding and is in agreement with plan.   Findings and plan of care discussed with supervising physician Dr. Wilkie Aye who has evaluated patient as shared visit & is in agreement.    Final Clinical Impression(s) / ED Diagnoses Final diagnoses:  Nonintractable headache, unspecified chronicity pattern, unspecified headache type    Rx / DC Orders ED Discharge Orders         Ordered    lidocaine (LIDODERM) 5 %  Daily PRN        01/05/20 [address] [place] R, PA-C 01/05/20 1323    Rozelle Logan, DO 01/05/20 2043

## 2020-01-05 NOTE — Discharge Instructions (Addendum)
You were seen in the ER today for headaches.  Your head CT did not show any new problems such as a bleed or a mass.  We are sending home with Lidoderm patches, please apply 1 patch to the left side of your neck once daily as needed for pain.  Remove and discard patch within 12 hours of application.  Please take Tylenol per over-the-counter dosing.  Please follow-up with your primary care provider for recheck of your symptoms within 5 days.  Return to the ER for new or worsening symptoms including but not limited to worsening pain, sudden change in pain, new pain, change in your vision, fever, neck stiffness, numbness, weakness, passing out, or any other concerns.  Your blood pressure was elevated in the Er please be sure to take your blood pressure medication as prescribed and have this rechecked by your primary care provider.

## 2020-01-08 ENCOUNTER — Emergency Department (HOSPITAL_BASED_OUTPATIENT_CLINIC_OR_DEPARTMENT_OTHER): Admission: EM | Admit: 2020-01-08 | Discharge: 2020-01-08 | Discharge status: Against Medical Advice | Payer: PPO

## 2020-01-08 ENCOUNTER — Other Ambulatory Visit: Payer: Self-pay

## 2020-01-08 DIAGNOSIS — M542 Cervicalgia: Secondary | ICD-10-CM | POA: Diagnosis not present

## 2020-01-08 DIAGNOSIS — G4489 Other headache syndrome: Secondary | ICD-10-CM | POA: Diagnosis not present

## 2020-01-08 DIAGNOSIS — I10 Essential (primary) hypertension: Secondary | ICD-10-CM | POA: Diagnosis not present

## 2020-01-08 DIAGNOSIS — R52 Pain, unspecified: Secondary | ICD-10-CM | POA: Diagnosis not present

## 2020-01-08 DIAGNOSIS — R519 Headache, unspecified: Secondary | ICD-10-CM | POA: Diagnosis not present

## 2020-01-14 DIAGNOSIS — M542 Cervicalgia: Secondary | ICD-10-CM | POA: Insufficient documentation

## 2020-01-14 DIAGNOSIS — Z981 Arthrodesis status: Secondary | ICD-10-CM

## 2020-01-14 HISTORY — DX: Arthrodesis status: Z98.1

## 2020-01-14 HISTORY — DX: Cervicalgia: M54.2

## 2020-06-21 DIAGNOSIS — M2041 Other hammer toe(s) (acquired), right foot: Secondary | ICD-10-CM | POA: Insufficient documentation

## 2020-06-21 DIAGNOSIS — L84 Corns and callosities: Secondary | ICD-10-CM

## 2020-06-21 HISTORY — DX: Corns and callosities: L84

## 2020-06-21 HISTORY — DX: Other hammer toe(s) (acquired), right foot: M20.41

## 2021-03-07 DIAGNOSIS — J0140 Acute pansinusitis, unspecified: Secondary | ICD-10-CM | POA: Insufficient documentation

## 2021-03-07 HISTORY — DX: Acute pansinusitis, unspecified: J01.40

## 2021-06-16 DIAGNOSIS — K5732 Diverticulitis of large intestine without perforation or abscess without bleeding: Secondary | ICD-10-CM

## 2021-06-16 HISTORY — DX: Diverticulitis of large intestine without perforation or abscess without bleeding: K57.32

## 2021-06-17 DIAGNOSIS — K922 Gastrointestinal hemorrhage, unspecified: Secondary | ICD-10-CM

## 2021-06-17 DIAGNOSIS — A419 Sepsis, unspecified organism: Secondary | ICD-10-CM

## 2021-06-17 HISTORY — DX: Sepsis, unspecified organism: A41.9

## 2021-06-17 HISTORY — DX: Gastrointestinal hemorrhage, unspecified: K92.2

## 2021-07-14 DIAGNOSIS — F329 Major depressive disorder, single episode, unspecified: Secondary | ICD-10-CM

## 2021-07-14 HISTORY — DX: Major depressive disorder, single episode, unspecified: F32.9

## 2021-12-11 DIAGNOSIS — E871 Hypo-osmolality and hyponatremia: Secondary | ICD-10-CM | POA: Insufficient documentation

## 2021-12-11 DIAGNOSIS — K529 Noninfective gastroenteritis and colitis, unspecified: Secondary | ICD-10-CM

## 2021-12-11 DIAGNOSIS — R112 Nausea with vomiting, unspecified: Secondary | ICD-10-CM | POA: Insufficient documentation

## 2021-12-11 DIAGNOSIS — G3184 Mild cognitive impairment, so stated: Secondary | ICD-10-CM | POA: Insufficient documentation

## 2021-12-11 HISTORY — DX: Noninfective gastroenteritis and colitis, unspecified: K52.9

## 2021-12-11 HISTORY — DX: Hypo-osmolality and hyponatremia: E87.1

## 2022-04-18 DIAGNOSIS — S8262XA Displaced fracture of lateral malleolus of left fibula, initial encounter for closed fracture: Secondary | ICD-10-CM

## 2022-04-18 HISTORY — DX: Displaced fracture of lateral malleolus of left fibula, initial encounter for closed fracture: S82.62XA

## 2022-05-08 DIAGNOSIS — S82892D Other fracture of left lower leg, subsequent encounter for closed fracture with routine healing: Secondary | ICD-10-CM | POA: Insufficient documentation

## 2022-05-09 DIAGNOSIS — M25572 Pain in left ankle and joints of left foot: Secondary | ICD-10-CM

## 2022-05-09 HISTORY — DX: Pain in left ankle and joints of left foot: M25.572

## 2022-05-24 IMAGING — CT CT HEAD W/O CM
3 series · 16 of 47 positions shown, 19 images · non-contrast
Comparison: None.

CLINICAL DATA: Headache.

EXAM:
CT HEAD WITHOUT CONTRAST
TECHNIQUE: Contiguous axial images were obtained from the base of the skull
through the vertex without intravenous contrast.

[Series 2: head wo · axial · 0.40mm/px · z∈[-149,-24]mm · 10 of 31 slices shown, 13 images]
[im 3/31  brain]
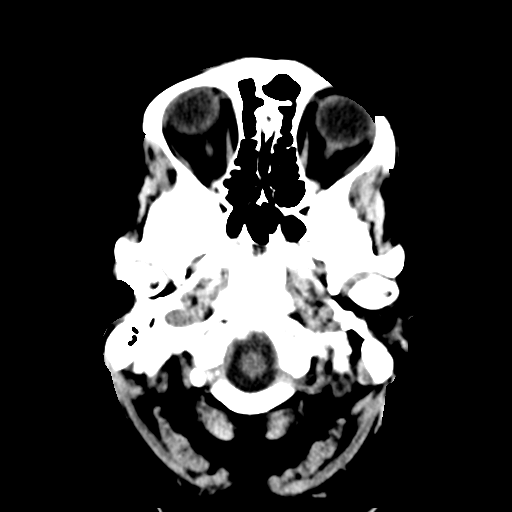
[im 3/31  bone]
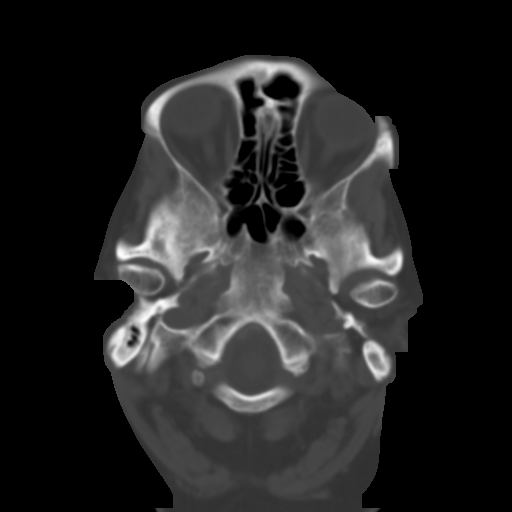
[im 6/31  brain]
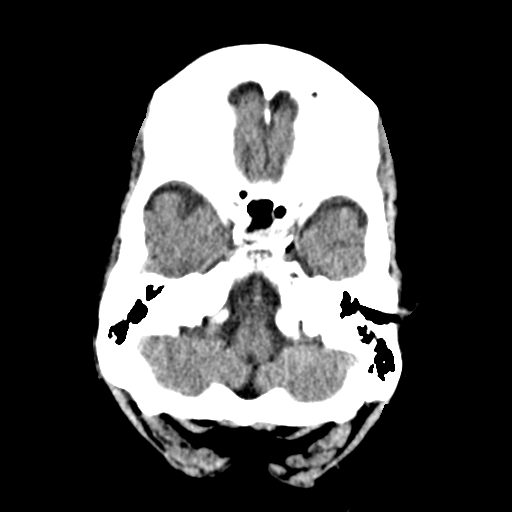
[im 9/31  brain]
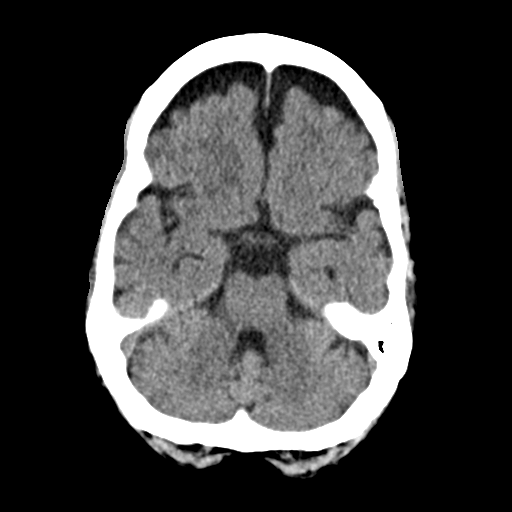
[im 11/31  brain]
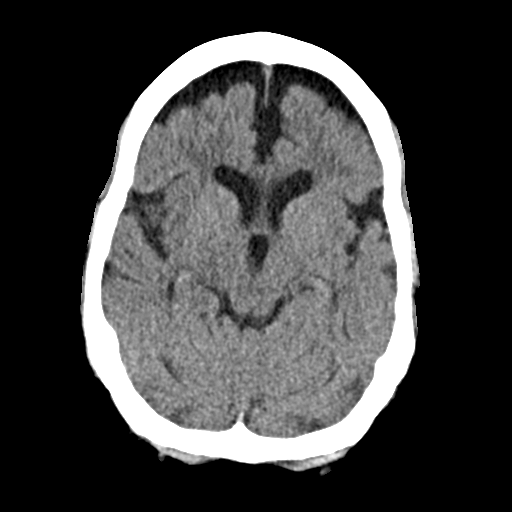
[im 14/31  brain]
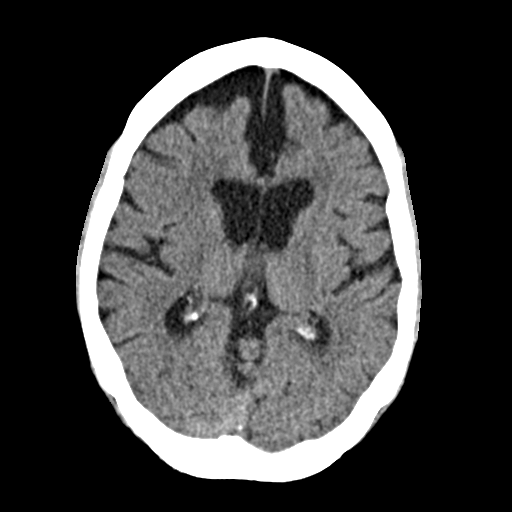
[im 14/31  bone]
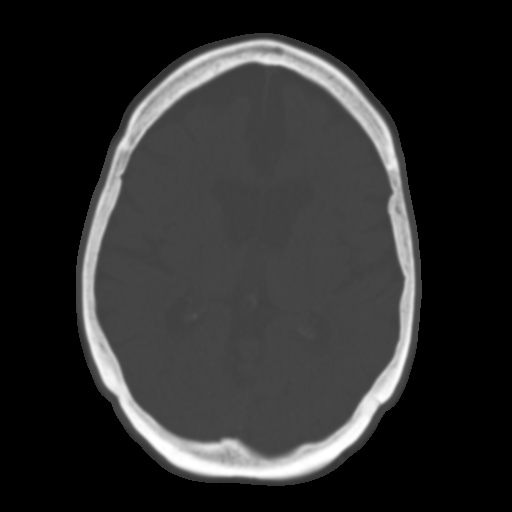
[im 17/31  brain]
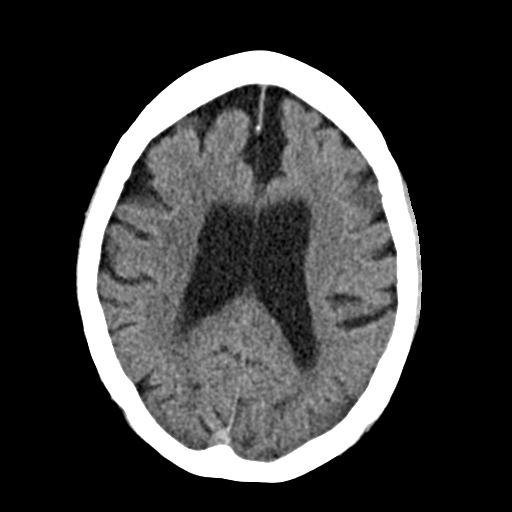
[im 20/31  brain]
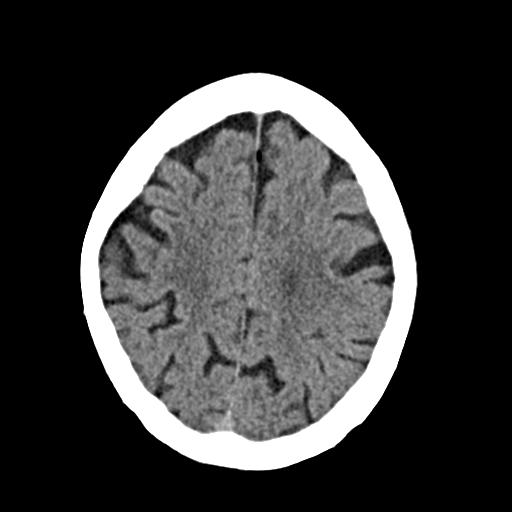
[im 23/31  brain]
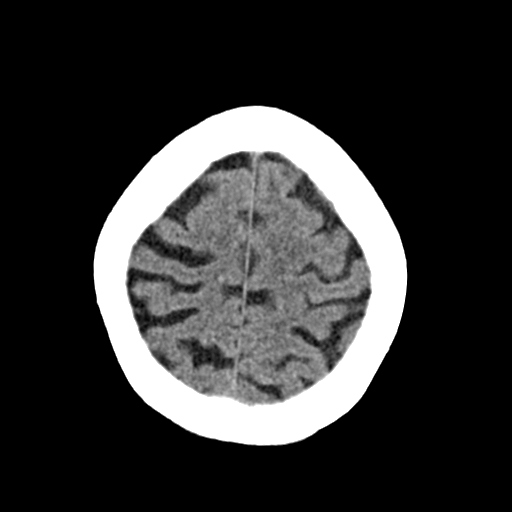
[im 25/31  brain]
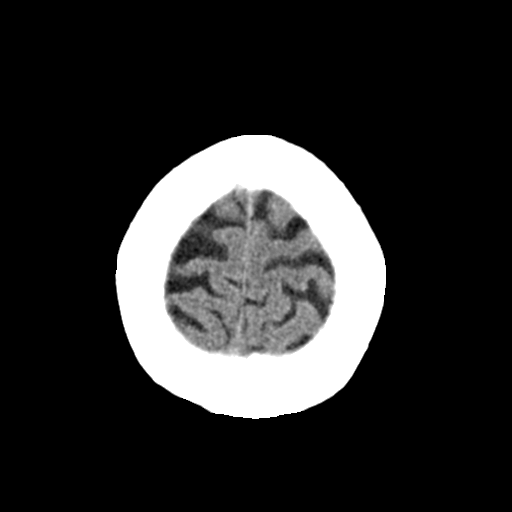
[im 25/31  bone]
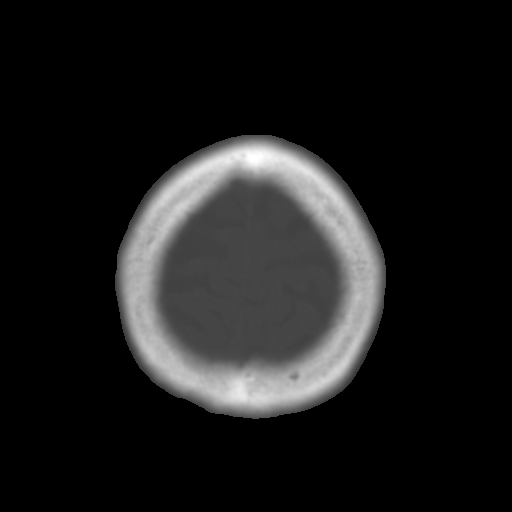
[im 28/31  brain]
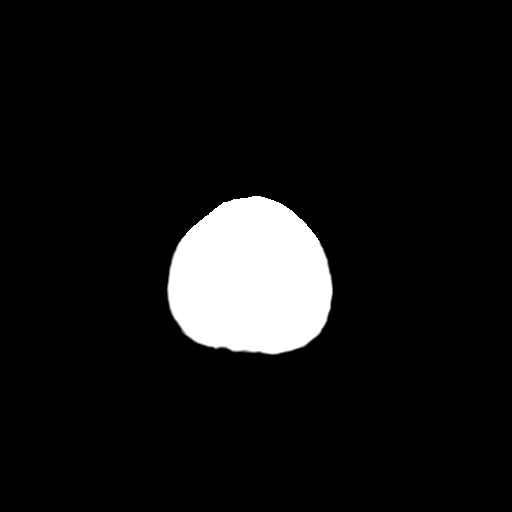

[Series 4: coronal soft · coronal · 0.32mm/px · 3 of 65 slices shown]
[im 22/65  brain]
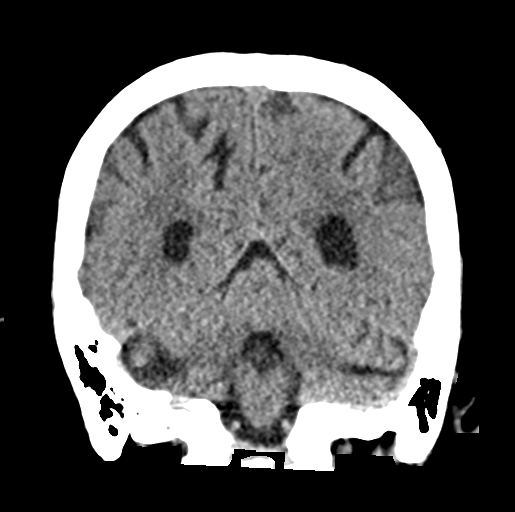
[im 29/65  brain]
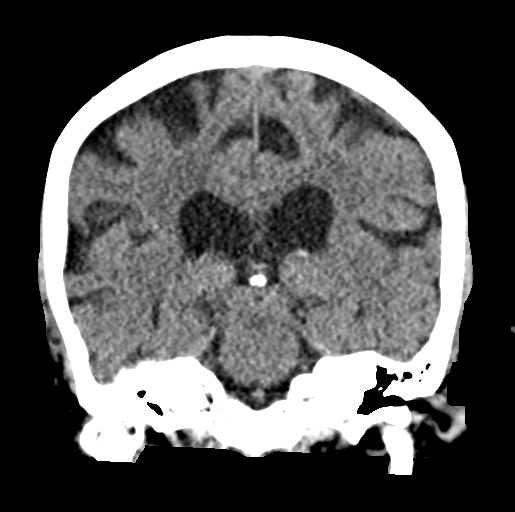
[im 36/65  brain]
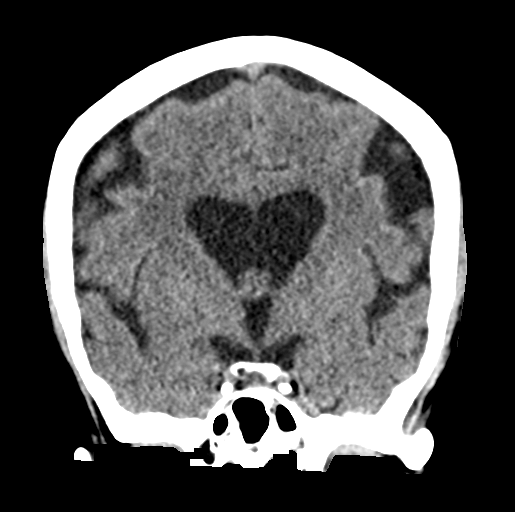

[Series 5: sag soft · sagittal · 0.31mm/px · 3 of 53 slices shown]
[im 18/53  brain]
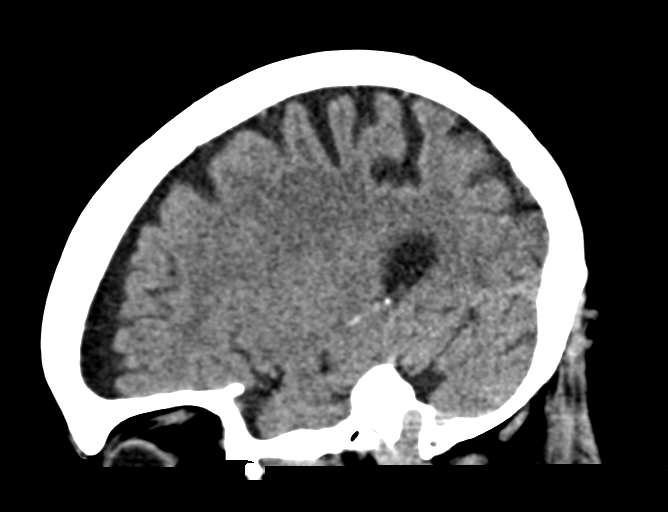
[im 27/53  brain]
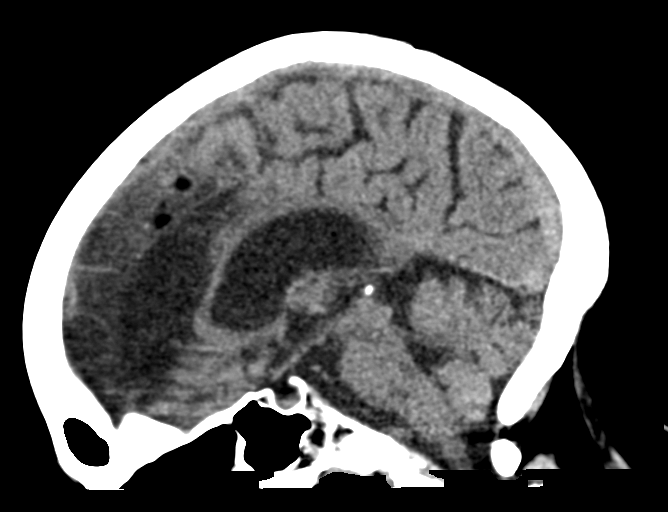
[im 35/53  brain]
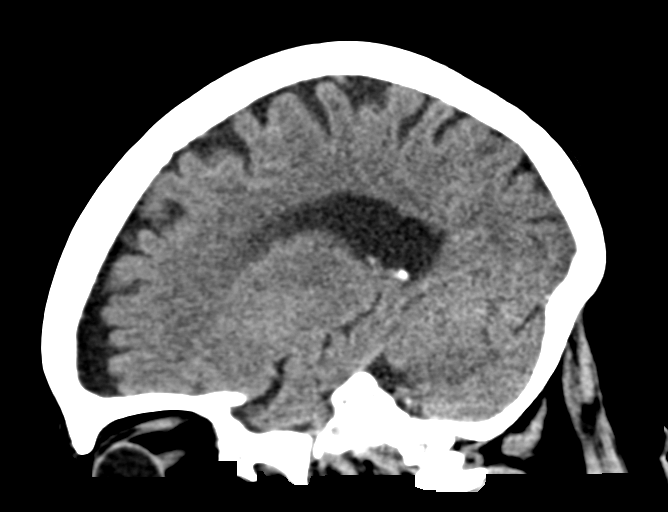

[16 of 47 positions shown; findings below may reference images not displayed]

FINDINGS: Brain: No evidence of acute infarction, hemorrhage, hydrocephalus,
extra-axial collection or mass lesion/mass effect. There is mild
diffuse low-attenuation within the subcortical and periventricular
white matter compatible with chronic microvascular disease.
Prominence of sulci and ventricles compatible with brain atrophy.

Vascular: No hyperdense vessel or unexpected calcification.

Skull: Normal. Negative for fracture or focal lesion.

Sinuses/Orbits: No acute finding.

Other: None.
IMPRESSION: 1. No acute intracranial abnormalities.
2. Chronic small vessel ischemic change and brain atrophy.

## 2022-07-10 ENCOUNTER — Encounter: Payer: Self-pay | Admitting: Physician Assistant

## 2022-08-07 ENCOUNTER — Encounter: Payer: Self-pay | Admitting: Physician Assistant

## 2022-08-07 ENCOUNTER — Other Ambulatory Visit (INDEPENDENT_AMBULATORY_CARE_PROVIDER_SITE_OTHER): Payer: Medicare HMO

## 2022-08-07 ENCOUNTER — Ambulatory Visit: Payer: Medicare HMO | Admitting: Physician Assistant

## 2022-08-07 ENCOUNTER — Ambulatory Visit: Payer: PPO

## 2022-08-07 VITALS — BP 153/78 | HR 74 | Resp 20 | Ht 64.5 in | Wt 172.0 lb

## 2022-08-07 DIAGNOSIS — R413 Other amnesia: Secondary | ICD-10-CM

## 2022-08-07 NOTE — Patient Instructions (Addendum)

## 2022-08-07 NOTE — Progress Notes (Signed)
Assessment/Plan:    The patient is seen in neurologic consultation at the request of [place], Remi Deter, NP for the evaluation of memory.  Maria Clay is a very pleasant 82 y.o. year old RH female with a history of hypertension, hyperlipidemia, DM2 with neuropathy and retinopathy, COPD, PAD, depression, IBS, OAB, and a diagnosis of dementia as per PCP on donepezil 5 mg daily, but unable to tolerate, now on rivastigmine 1.5 mg in am, 3 mg in the evening for the last 3 months (was not able to tolerate the higher dose in a.m due to increased sleepiness). She seen today for evaluation of memory loss.  Prior CT of the head from December 2021 personally reviewed was remarkable for chronic microvascular disease and atrophy with prominence of sulci and ventricles.  MoCA today is 18/30.Findings are suspicious for dementia likely due to Alzheimer's disease. Patient is able to participate on his IADLs.  Continues to drive      Dementia likely due to Alzheimer's disease.  MRI brain without contrast to assess for underlying structural abnormality and assess vascular load  Check B12, TSH Continue rivastigmine 1.5 mg am, 3 mg pm. Side effects discussed   side effects discussed Recommend good control of cardiovascular risk factors.   Continue to control mood as per PCP Folllow up in  6 weeks   Subjective:    The patient is accompanied by her daughter  who supplements the history.    How long did patient have memory difficulties?  Gradually , for at least 3 years . Patient has difficulty remembering recent conversations and people names, new information.She began taking donepezil less than 1 y ago but " the memory was worsening with the medicine". This was changed to rivastigmine, and it seems that "memory is not getting worse". She likes to read extensively Has not been doing brain games repeats oneself?  Endorsed Disoriented when walking into a room?  Denies   Leaving objects in unusual places?    denies   Wandering behavior? denies   Any personality changes ? denies   Any history of depression?: denies   Hallucinations or paranoia?  denies   Seizures? denies    Any sleep changes?  No sleep changes. Denies  vivid dreams, REM behavior or sleepwalking   Sleep apnea? denies   Any hygiene concerns?  denies   Independent of bathing and dressing?  Endorsed  Does the patient need help with medications? Daughter is in charge for the last 3 months because she may have forgotten doses.  Who is in charge of the finances? Daughter and son  is in charge     Any changes in appetite?  "I don't know what I ate or if I ate". She admits to not drinking enough water  Patient have trouble swallowing?  denies   Does the patient cook?  Yes, denies accidents Any headaches?  denies   Chronic back pain?  denies   Ambulates with difficulty? Needs  a cane to ambulate    Recent falls or head injuries? In April 2024 she had a mechanical fall with the carpet. She had ankle fracture , now fully recovered   Vision changes? No  Stroke like symptoms?  denies   Any tremors?  denies   Any anosmia?  denies   Any incontinence of urine? Endorsed due to OAB , uses Depends   Any bowel dysfunction? denies      Patient [place] History of heavy alcohol intake? denies  History of heavy tobacco use? denies   Family history of dementia?  Mo had dementia ? Type  Does patient drive? Yes, denies any issues   Allergies  Allergen Reactions   Tramadol Other (See Comments), Itching and Nausea Only   Codeine Nausea Only   Donepezil Other (See Comments)    Current Outpatient Medications  Medication Instructions   albuterol (VENTOLIN HFA) 108 (90 Base) MCG/ACT inhaler Inhalation   amitriptyline (ELAVIL) 25 mg   aspirin EC 81 MG tablet One half of a baby aspirin daily as able to be tolerated.   Blood Glucose Monitoring Suppl (TGT BLOOD GLUCOSE MONITORING) w/Device KIT Dispense 1 onetouch verio flex glucometer    gabapentin (NEURONTIN) 300 mg, Oral, Daily at bedtime   lidocaine (LIDODERM) 5 % 1 patch, Transdermal, Daily PRN, Apply patch to area most significant pain once per day.  Remove and discard patch within 12 hours of application.     VITALS:   Vitals:   08/07/22 1329  BP: (!) 153/78  Pulse: 74  Resp: 20  SpO2: 98%  Weight: 172 lb (78 kg)  Height: 5' 4.5" (1.638 m)       No data to display          PHYSICAL EXAM   HEENT:  Normocephalic, atraumatic. The mucous membranes are moist. The superficial temporal arteries are without ropiness or tenderness. Cardiovascular: Regular rate and rhythm. Lungs: Clear to auscultation bilaterally. Neck: There are no carotid bruits noted bilaterally.  NEUROLOGICAL:    08/07/2022    8:00 PM  Montreal Cognitive Assessment   Visuospatial/ Executive (0/5) 2  Naming (0/3) 3  Attention: Read list of digits (0/2) 2  Attention: Read list of letters (0/1) 1  Attention: Serial 7 subtraction starting at 100 (0/3) 1  Language: Repeat phrase (0/2) 1  Language : Fluency (0/1) 1  Abstraction (0/2) 2  Delayed Recall (0/5) 0  Orientation (0/6) 4  Total 17  Adjusted Score (based on education) 18        No data to display           Orientation:  Alert and oriented to person, place and time. No aphasia or dysarthria. Fund of knowledge is appropriate. Recent and remote memory impaired.  Attention and concentration are normal  Able to name objects and repeat phrases 1/2. Delayed recall    Cranial nerves: There is good facial symmetry. Extraocular muscles are intact and visual fields are full to confrontational testing. Speech is fluent and clear, no tongue deviation. Hearing is intact to conversational tone. Tone: Tone is good throughout. Sensation: Sensation is intact to light touch and pinprick throughout. Vibration is intact at the bilateral big toe. Coordination: The patient has no difficulty with RAM's or FNF bilaterally. Normal finger to nose   Motor: Strength is 5/5 in the bilateral upper and lower extremities. There is no pronator drift. There are no fasciculations noted. DTR's: Deep tendon reflexes are 2/4 .  Plantar responses are downgoing bilaterally. Gait and Station: The patient is able to ambulate without difficulty.  Gait is cautious and narrow.      Thank you for allowing [place] the opportunity to participate in the care of this nice patient. Please do not hesitate to contact us for any questions or concerns.   Total time spent on today's visit was 46 minutes dedicated to this patient today, preparing to see patient, examining the patient, ordering tests and/or medications and counseling the patient, documenting clinical information in the EHR or  other health record, independently interpreting results and communicating results to the patient/family, discussing treatment and goals, answering patient's questions and coordinating care.  Cc:  Herma Carson, MD  Marlowe Kays 08/07/2022 8:07 PM

## 2022-08-08 NOTE — Progress Notes (Signed)
B12 on the lower side, recommend  starting on vitamin B12 1000 mcg daily.  Thyroid is normal Follow-up B12 with PCP.  Thank you

## 2022-08-31 ENCOUNTER — Encounter: Payer: Self-pay | Admitting: Physician Assistant

## 2022-09-06 ENCOUNTER — Inpatient Hospital Stay: Admission: RE | Admit: 2022-09-06 | Payer: Medicare HMO | Source: Ambulatory Visit

## 2022-10-03 ENCOUNTER — Ambulatory Visit
Admission: RE | Admit: 2022-10-03 | Discharge: 2022-10-03 | Disposition: A | Discharge status: Home / Self Care | Payer: Medicare HMO | Source: Ambulatory Visit | Attending: Physician Assistant | Admitting: Physician Assistant

## 2022-10-08 ENCOUNTER — Ambulatory Visit: Payer: Medicare HMO | Admitting: Physician Assistant

## 2022-10-08 DIAGNOSIS — Z029 Encounter for administrative examinations, unspecified: Secondary | ICD-10-CM

## 2022-10-08 NOTE — Progress Notes (Signed)
The MRI of the brain  showed mild hardening of the small blood vessels in the brain in patients with high cholesterol, blood pressure or sugar control issues,  and some age related changes in the brain similar to the brain MRI from 2021.  It did not show any tumors,or bleed. There is a remote, old, tiny stroke but no new strokes Thanks

## 2022-10-09 ENCOUNTER — Encounter: Payer: Self-pay | Admitting: Physician Assistant

## 2022-10-15 ENCOUNTER — Encounter: Payer: Self-pay | Admitting: Psychology

## 2022-10-15 ENCOUNTER — Ambulatory Visit: Payer: Medicare HMO | Admitting: Psychology

## 2022-10-15 ENCOUNTER — Ambulatory Visit (INDEPENDENT_AMBULATORY_CARE_PROVIDER_SITE_OTHER): Payer: Medicare HMO | Admitting: Psychology

## 2022-10-15 DIAGNOSIS — R4189 Other symptoms and signs involving cognitive functions and awareness: Secondary | ICD-10-CM

## 2022-10-15 DIAGNOSIS — F03A0 Unspecified dementia, mild, without behavioral disturbance, psychotic disturbance, mood disturbance, and anxiety: Secondary | ICD-10-CM

## 2022-10-15 HISTORY — DX: Unspecified dementia, mild, without behavioral disturbance, psychotic disturbance, mood disturbance, and anxiety: F03.A0

## 2022-10-15 NOTE — Progress Notes (Unsigned)
NEUROPSYCHOLOGICAL EVALUATION [place] Department of Neurology  Date of Evaluation: October 15, 2022  Reason for Referral:   Maria Clay is a 82 y.o. right-handed Caucasian female referred by Maria Kays, PA-C, to characterize her current cognitive functioning and assist with diagnostic clarity and treatment planning in the context of subjective cognitive decline.   Assessment and Plan:   Clinical Impression(s): Maria Clay's pattern of performance is suggestive of severe impairment surrounding both encoding (i.e., learning) and delayed retrieval aspects of memory. Additional impairments were exhibited across cognitive flexibility and clock drawing, while relative weaknesses were exhibited across semantic fluency and recognition/consolidation aspects of memory. Further performance variability was exhibited across basic attention. Performances were appropriate relative to age-matched peers across processing speed, complex attention, confrontation naming, and non-clock drawing aspects of visuospatial abilities. Functionally, despite Maria Clay's report of continued driving with minimal issue, her family has had to fully take over all aspects of medication management, financial management, and bill paying responsibilities. Given evidence for cognitive impairment and the strong likelihood that it is directly interfering with her day-to-day functioning, [name] best meets diagnostic criteria for a Major Neurocognitive Disorder ("dementia") at the present time.  The etiology for her somewhat mild dementia presentation is unclear at the present time. With that being said, I do feel that concerns for an underlying neurodegenerative illness, namely Alzheimer's disease, are reasonable. Maria Clay did not benefit from repeated exposure to novel information and was fully amnestic (i.e., 0% retention) across 2/3 memory tasks. She also performed quite poorly across 2/3  yes/no recognition trials. 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 Further weakness surrounding semantic fluency, cognitive flexibility, and clock drawing would follow fairly typical disease progression, escalating concerns. Strong confrontation naming performances are certainly encouraging. While I cannot rule in Alzheimer's disease with strong confidence, this illness certainly cannot be ruled out and there is concern for its presence in earlier stages. Continued medical monitoring will be important moving forward.  Despite what was previously theorized, I find it unlikely that potential sleep apnea concerns would create current patterns of deficit across testing. This is especially true after a more recent sleep study did not suggest clinically present obstructive sleep apnea warranting ongoing treatment. Recent neuroimaging suggested only mild microvascular ischemic disease and a chronic left cerebellar lacunar infarct, neither of which would create memory impairment to this extent, making a primary vascular cause unlikely. She also does not display any behavioral characteristics of Lewy body disease, Parkinson's disease, another more rare parkinsonian condition, or frontotemporal lobar degeneration.   Recommendations: A repeat neuropsychological evaluation in 12-18 months (or sooner if functional decline is noted) is recommended to assess the trajectory of future cognitive decline should it occur. This will also aid in future efforts towards improved diagnostic clarity.  Maria Clay has already been prescribed a medication aimed to address memory loss and concerns surrounding Alzheimer's disease (i.e., rivastigmine/Exelon). She is encouraged to continue taking this medication as prescribed. It is important to highlight that this medication has been shown to slow functional decline in  some individuals. There is no current treatment which can stop or reverse cognitive decline when caused by a neurodegenerative illness.   Performance across neurocognitive testing is not a strong predictor of an individual's safety operating a motor vehicle. However, some concern is certainly reasonable given deficits surrounding cognitive flexibility and memory. I would recommend  that she and her family strongly consider completing a formal driving evaluation to ensure safety behind the wheel. Should her family wish to pursue a formalized driving evaluation, they could reach out to the following agencies: [employer] in [place]: [phone] [facility]: [phone] [facility]: [phone] [facility]: [phone] or [phone]  It will be important for Maria Clay to have another person with her when in situations where she may need to process information, weigh the pros and cons of different options, and make decisions, in order to ensure that she fully understands and recalls all information to be considered.  If not already done, Maria Clay and her family may want to discuss her wishes regarding durable power of attorney and medical decision making, so that she can have input into these choices. If they require legal assistance with this, long-term care resource access, or other aspects of estate planning, they could reach out to [employer] at [phone] for a free consultation. Additionally, they may wish to discuss future plans for caretaking and seek out community options for in home/residential care should they become necessary.  Maria Clay is encouraged to attend to lifestyle factors for brain health (e.g., regular physical exercise, good nutrition habits and consideration of the MIND-DASH diet, regular participation in cognitively-stimulating activities, and general stress management techniques), which are likely to have  benefits for both emotional adjustment and cognition. Optimal control of vascular risk factors (including safe cardiovascular exercise and adherence to dietary recommendations) is encouraged. Continued participation in activities which provide mental stimulation and social interaction is also recommended.   Important information should be provided to Ms. Canova in written format in all instances. This information should be placed in a highly frequented and easily visible location within her home to promote recall. External strategies such as written notes in a consistently used memory journal, visual and nonverbal auditory cues such as a calendar on the refrigerator or appointments with alarm, such as on a cell phone, can also help maximize recall.  To address problems with processing speed, she may wish to consider:   -Ensuring that she is alerted when essential material or instructions are being presented   -Adjusting the speed at which new information is presented   -Allowing for more time in comprehending, processing, and responding in conversation   -Repeating and paraphrasing instructions or conversations aloud  To address problems with fluctuating attention and/or executive dysfunction, she may wish to consider:   -Avoiding external distractions when needing to concentrate   -Limiting exposure to fast paced environments with multiple sensory demands   -Writing down complicated information and using checklists   -Attempting and completing one task at a time (i.e., no multi-tasking)   -Verbalizing aloud each step of a task to maintain focus   -Taking frequent breaks during the completion of steps/tasks to avoid fatigue   -Reducing the amount of information considered at one time   -Scheduling more difficult activities for a time of day where she is usually most alert  Review of Records:   Ms. Isais completed a comprehensive neuropsychological evaluation Lonia Chimera, Psy.D.) on  02/01/2022. Results of that evaluation suggested that "Ms. Haggart's presentation appears to be most consistent with a mild neurocognitive disorder characterized primarily by problems with attention and memory. The etiology of these difficulties is unclear but could be related to possible untreated sleep apnea (if present) and her complex medical history with multiple chronic health problems. At present, her cognitive profile does not  appear to be particularly consistent with something like Alzheimer's disease, though this cannot entirely be ruled out and further work up is warranted and would be helpful for diagnostic clarification (e.g., neuroimaging, neurology consult)." She was referred to neurology and met with a provider within the [facility] network Rosario Jacks, M.D.) on 03/20/2022 for follow-up.  Ms. Rettinger met with Dr. Alton Revere for follow-up on 05/30/2022. Notably, a recent at-home sleep study was not suggestive of obstructive sleep apnea.   Ms. Kilts was seen by [facility] Maria Clay, [place]) on 08/07/2022 for a second opinion and evaluation surrounding memory loss. Memory difficulties were said to be present for the past three years and have progressively worsened over time. Examples included trouble recalling names and details of recent conversations. Functionally, her son and daughter have taken over all medication and financial responsibilities. She continues to drive without reported difficulty. Performance across a brief cognitive screening instrument (MOCA) was 18/30. Ultimately, Ms. Vita was referred for a comprehensive neuropsychological evaluation to obtain a second opinion regarding an attempt to characterize her cognitive abilities and to assist with diagnostic clarity and treatment planning.  Neuroimaging Head CT on 01/05/2020 revealed cerebral atrophy of unspecified severity and mild microvascular ischemic disease. Brain MRI on 10/08/2022 revealed generalized cerebral  volume loss of unspecified severity (but stable relative to a unspecified previous scan) and mild microvascular ischemic disease. It also revealed a chronic lacunar infarct in the left cerebellum.   Past Medical History:  Diagnosis Date   Abdominal distention 08/14/2018   Acute left ankle pain 05/09/2022   Acute non-recurrent pansinusitis 03/07/2021   Last Assessment & Plan:    Acute, uncontrolled, given timeline will treat with antibiotics   - start augmentin   - message back in 1 week, if only mild improvement will consider addition of singulair 10mg  nightly, SE already discussed   - referral to allergy as per patient request     Age-related osteoporosis without current pathological fracture 07/18/2013   Agoraphobia 09/17/2017   Chronic, intermittent  - patient on low dose klonopin as needed  - does not take daily  - discussed risks and benefits of med  - will give daily prn and see how long this lasts  - no refill needed today  - patient compliant  - Reviewed [place] controlled substances registry     Allergic rhinitis 05/04/2015   Asymptomatic premature ventricular contractions 07/27/2011   Benign essential hypertension 07/27/2011   Last Assessment & Plan:    Chronic, well controlled, goal < 130/80   - cont current management   - cont walking     Callus of foot 06/21/2020   Cardiac conduction disorder 07/18/2013   Chronic gastric ulcer 07/18/2013   Chronic obstructive pulmonary disease (COPD) 07/18/2013   Closed fracture of lateral malleolus of left ankle 04/18/2022   Cobalamin deficiency 07/18/2013   Colitis 12/11/2021   Combined forms of age-related cataract of both eyes 09/20/2016   Diastasis recti 08/14/2018   Esophageal dysphagia 05/24/2016   Esophageal stricture 07/18/2013   Fatigue 07/18/2013   Gastroesophageal reflux disease without esophagitis 05/04/2015   Last Assessment & Plan:    Chronic, uncontrolled; could be due to diet versus worsening gastroparesis   - Continue  PPI twice daily   - Continue Pepcid at night   - Start Carafate up to 4 times daily as needed   - Advised patient she cannot take this with other meds as it will inhibit absorption of them   - Advised patient  to reach out to GI and I will also send a message because she needs an   Gastroparesis 08/14/2018   Generalized anxiety disorder with panic attacks 01/25/2017   Hammertoe of right foot 06/21/2020   Hiatal hernia 07/18/2013   Hyponatremia 12/11/2021   IBS (irritable bowel syndrome) 07/18/2013   IDA (iron deficiency anemia) 07/18/2013   Lower GI bleed 06/17/2021   Lumbar spondylosis 04/30/2017   Major depressive disorder 07/14/2021   Mild cognitive impairment 12/11/2021   Mitral valve disorder 07/18/2013   Mixed hyperlipidemia 07/27/2011   Last Assessment & Plan:    Chronic, stable, LDL goal < 70   - cont statin   Last Assessment & Plan:  Chronic, stable, LDL goal < 70 - cont statin     Neck pain 01/14/2020   Nontraumatic complete tear of left rotator cuff 09/02/2017   OAB (overactive bladder) 07/18/2013   Obesity 07/18/2013   Pain of cervical facet joint 01/14/2020   Primary localized osteoarthrosis, forearm 07/18/2013   Primary localized osteoarthrosis, lower leg 07/18/2013   Ptosis of eyelid, bilateral 12/30/2018   Reflux gastritis 07/18/2013   Restless leg syndrome 07/18/2013   Restrictive lung disease 11/25/2015   S/P cervical spinal fusion 01/14/2020   Sepsis (HCC) 06/17/2021   Sigmoid diverticulitis 06/16/2021   Submucosal lesion of stomach 07/18/2013   Trochanteric bursitis of left hip 04/30/2017    Past Surgical History:  Procedure Laterality Date   ABDOMINAL HYSTERECTOMY     HERNIA REPAIR     TONSILLECTOMY      Current Outpatient Medications:    albuterol (VENTOLIN HFA) 108 (90 Base) MCG/ACT inhaler, Inhale into the lungs., Disp: , Rfl:    amitriptyline (ELAVIL) 25 MG tablet, 25 mg., Disp: , Rfl:    aspirin EC 81 MG tablet, One half of a baby aspirin  daily as able to be tolerated., Disp: , Rfl:    Blood Glucose Monitoring Suppl (TGT BLOOD GLUCOSE MONITORING) w/Device KIT, Dispense 1 onetouch verio flex glucometer, Disp: , Rfl:    gabapentin (NEURONTIN) 300 MG capsule, Take 300 mg by mouth at bedtime., Disp: , Rfl:    lidocaine (LIDODERM) 5 %, Place 1 patch onto the skin daily as needed. Apply patch to area most significant pain once per day.  Remove and discard patch within 12 hours of application., Disp: 30 patch, Rfl: 0  Clinical Interview:   The following information was obtained during a clinical interview with Ms. Marquess and her daughter prior to cognitive testing.  Cognitive Symptoms: Decreased short-term memory: Endorsed. Primary examples surrounded trouble recalling names and details of recent conversations. Her daughter noted that Ms. Pudwill experiences rapid forgetting and is increasingly repetitive in day-to-day conversation. She also noted that Ms. Kirt will frequently misplace things around her residence. Ms. Schwenke described short-term memory concerns for the past several years, said to have gradually progressed over time. Her daughter was in agreement, noting that said decline had seemed to have accelerated during the past year.  Decreased long-term memory: Denied. Decreased attention/concentration: Denied. Her daughter noted the potential for some milder decline surrounding sustained attention and increased distractibility.  Reduced processing speed: Endorsed "some." Difficulties with executive functions: Denied. Both Ms. Weist and her daughter denied trouble with impulsivity or any significant personality changes.  Difficulties with emotion regulation: Denied. Difficulties with receptive language: Denied. Difficulties with word finding: Denied. Decreased visuoperceptual ability: Denied.  Difficulties completing ADLs: Endorsed. Ms. Leyda's daughter has fully taken over medication management. She noted that she must  provide daily phone calls/reminders for Ms. Nelis to take her medications. Otherwise, it is very likely that she will forget to take them altogether. Ms. Gist's son and daughter have also fully taken over all financial management and bill paying responsibilities. She continues to drive and denied any ongoing safety or navigational concerns.  Additional Medical History: History of traumatic brain injury/concussion: Denied. History of stroke: As stated above, her most recent brain MRI revealed a chronic lacunar infarct involving the left cerebellum. This likely represented an incidental finding. History of seizure activity: Denied. History of known exposure to toxins: Denied. Symptoms of chronic pain: Denied. Experience of frequent headaches/migraines: Denied. Frequent instances of dizziness/vertigo: Denied.  Sensory changes: She utilizes reading glasses with benefit. Other sensory changes/difficulties (e.g., hearing, taste, smell) were denied.  Balance/coordination difficulties: She reported some instances of reduced stability while ambulating and does often walk with a cane for precautionary reasons. She described a recent fall this past April which led to an ankle fracture. When asked today, she reported no recollection of this fall. Other more recent falls were denied. One side of the body was not said to be subjectively weaker than the other.  Other motor difficulties: Denied.  Sleep History: Estimated hours obtained each night: 8 hours.  Difficulties falling asleep: Denied. Difficulties staying asleep: She described occasional instances where she will wake around 2:00 am and then have to read something for an hour or two before being able to fall back asleep.  Feels rested and refreshed upon awakening: Endorsed.  History of snoring: Unclear.  History of waking up gasping for air: Denied. Witnessed breath cessation while asleep: Endorsed. Her daughter did describe some intermittent  episodes where apneic experiences were suspected. A recent at-home sleep study was not suggestive of obstructive sleep apnea.   History of vivid dreaming: Denied. Excessive movement while asleep: Denied. Instances of acting out her dreams: Denied.  Psychiatric/Behavioral Health History: Depression: She described her current mood as "fine." She acknowledged a longstanding history of depressive symptoms. She expressed her belief that current symptoms were managed well. She denied current or remote suicidal ideation, intent, or plan.  Anxiety: She acknowledged a longstanding history of generalized anxious distress with some prior concerns surrounding panic attacks and agoraphobia. She expressed her belief that current symptoms were managed well.  Mania: Denied. Trauma History: Denied. Visual/auditory hallucinations: Denied. Delusional thoughts: Denied.  Tobacco: Denied. Alcohol: She reported very rare alcohol consumption and denied a history of problematic alcohol abuse or dependence.  Recreational drugs: Denied.  Family History: Problem Relation Age of Onset   Dementia Mother        unspecified type; w/advanced age   This information was confirmed by Ms. Hinchliffe.  Academic/Vocational History: Highest level of educational attainment: 12 years. She graduated from high school and described herself as an average (B/C) student in academic settings. 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 However, she discontinued this quickly and did not complete a full academic year. She described a potential relative academic weakness surrounding math.  History of developmental delay: Denied. History of grade repetition: Denied. Enrollment in special education courses: Denied. History of LD/ADHD: Denied.  Employment: Retired. She primarily worked in Scientist, research (life sciences) positions.   Evaluation Results:   Behavioral Observations: Ms. Wrede was accompanied by her daughter, arrived  to her appointment on time, and was appropriately dressed and groomed. She appeared alert and oriented. Observed gait and station were within normal limits. Gross motor functioning appeared intact upon informal observation and  no abnormal movements (e.g., tremors) were noted. Her affect was generally relaxed and positive. Spontaneous speech was fluent and word finding difficulties were not observed during the clinical interview. Thought processes were coherent, organized, and normal in content. Insight into her cognitive difficulties appeared limited and I do not believe Ms. Phariss is able to fully appreciate the extent of ongoing impairment.   During testing, sustained attention was appropriate. Task engagement was adequate. She did fatigue as the evaluation progressed. Following the completion of a digit span task, she abruptly commented "I want to stop, my brain is sore." She did not respond to encouragement provided by the psychometrist and the evaluation was discontinued prematurely. Overall, Ms. Vandermeer was cooperative with the clinical interview and somewhat so with subsequent testing procedures.   Adequacy of Effort: The validity of neuropsychological testing is limited by the extent to which the individual being tested may be assumed to have exerted adequate effort during testing. Ms. Stalnaker expressed her intention to perform to the best of her abilities and exhibited adequate task engagement and persistence. Scores across stand-alone and embedded performance validity measures were variable but largely within expectation. Her sole below expectation performance is believed to be due to true memory impairment rather than poor engagement or attempts to perform poorly. As such, the results of the current evaluation are believed to be a valid representation of Ms. Slabach's current cognitive functioning.  Test Results: Ms. Sharber was mildly disoriented at the time of the current evaluation. She  incorrectly stated the current month ("September") and date. When asked why she was here, she responded "I guess I'm having some memory problems."  Intellectual abilities based upon educational and vocational attainment were estimated to be in the average range. Premorbid abilities were estimated to be within the average range based upon a single-word reading test.   Processing speed was average. Basic attention was variable, ranging from the well below average to average normative ranges. More complex attention (e.g., working memory) was below average to average. Cognitive flexibility was exceptionally low. Other aspects of executive functioning were unable to be assessed due to diminished testing tolerance and fatigue.  Receptive language abilities were unable to be assessed directly. Ms. Shoaff did not exhibit prominent difficulties comprehending task instructions and answered all questions asked of her appropriately. Assessed expressive language was variable. Phonemic fluency was average, semantic fluency was well below average to below average, and confrontation naming was above average to well above average.     Assessed visuospatial/visuoconstructional abilities were below average to average outside of deficits drawing a clock. Difficulties across her clock drawing were seen across hand placement. She exhibited confusion, ultimately placing four separate clock hands, all in incorrect locations.    Learning (i.e., encoding) of novel verbal information was exceptionally low. Spontaneous delayed recall (i.e., retrieval) of previously learned information was exceptionally low to well below average. Retention rates were 50% (raw score of 2) across a story learning task, 0% across a list learning task, and 0% across a figure drawing task. Performance across recognition tasks was variable but below expectation overall, ranging from the exceptionally low to average normative ranges, suggesting some limited  evidence for information consolidation.   Results of emotional screening instruments suggested that recent symptoms of generalized anxiety were in the minimal range, while symptoms of depression were within normal limits. A screening instrument assessing recent sleep quality suggested the presence of minimal sleep dysfunction.  Tables of Scores:   Note: This summary of test scores accompanies  the interpretive report and should not be considered in isolation without reference to the appropriate sections in the text. Descriptors are based on appropriate normative data and may be adjusted based on clinical judgment. Terms such as "Within Normal Limits" and "Outside Normal Limits" are used when a more specific description of the test score cannot be determined.       Percentile - Normative Descriptor > 98 - Exceptionally High 91-97 - Well Above Average 75-90 - Above Average 25-74 - Average 9-24 - Below Average 2-8 - Well Below Average < 2 - Exceptionally Low       Validity:   DESCRIPTOR       DCT: --- --- Within Normal Limits  RBANS EI: --- --- Outside Normal Limits  WAIS-IV RDS: --- --- Within Normal Limits       Orientation:      Raw Score Percentile   NAB Orientation, Form 1 26/29 --- ---       Cognitive Screening:      Raw Score Percentile   SLUMS: 17/30 --- ---       RBANS, Form A: Standard Score/ Scaled Score Percentile   Total Score 69 2 Well Below Average  Immediate Memory 57 <1 Exceptionally Low    List Learning 3 1 Exceptionally Low    Story Memory 3 1 Exceptionally Low  Visuospatial/Constructional 92 30 Average    Figure Copy 11 63 Average    Line Orientation 13/20 10-16 Below Average  Language 86 18 Below Average    Picture Naming 10/10 >75 Above Average    Semantic Fluency 4 2 Well Below Average  Attention 88 21 Below Average    Digit Span 5 5 Well Below Average    Coding 11 63 Average  Delayed Memory 56 <1 Exceptionally Low    List Recall 0/10 <2 Exceptionally  Low    List Recognition 15/20 <2 Exceptionally Low    Story Recall 4 2 Well Below Average    Story Recognition 7/12 7-13 Well Below Average to Below Average    Figure Recall 1 <1 Exceptionally Low    Figure Recognition 4/8 21-38 Below Average to Average        Intellectual Functioning:      Standard Score Percentile   Test of Premorbid Functioning: 94 34 Average       Attention/Executive Function:     Trail Making Test (TMT): Raw Score (T Score) Percentile     Part A 40 secs.,  0 errors (49) 46 Average    Part B Discontinued --- Impaired         Scaled Score Percentile   WAIS-IV Digit Span: 8 25 Average    Forward 8 25 Average    Backward 8 25 Average    Sequencing 7 16 Below Average       Language:     Verbal Fluency Test: Raw Score (Scaled Score) Percentile     Phonemic Fluency (CFL) 24 (8) 25 Average    Category Fluency 24 (6) 9 Below Average  *Based on [employer] Older Normative Studies (MOANS)          NAB Language Module, Form 1: T Score Percentile     Naming 31/31 (64) 92 Well Above Average       Visuospatial/Visuoconstruction:      Raw Score Percentile   Clock Drawing: 6/10 --- Impaired       Mood and Personality:      Raw Score Percentile   Geriatric Depression Scale:  7 --- Within Normal Limits  Geriatric Anxiety Scale: 4 --- Minimal    Somatic 3 --- Minimal    Cognitive 1 --- Minimal    Affective 0 --- Minimal       Additional Questionnaires:      Raw Score Percentile   PROMIS Sleep Disturbance Questionnaire: 19 --- None to Slight   Informed Consent and Coding/Compliance:   The current evaluation represents a clinical evaluation for the purposes previously outlined by the referral source and is in no way reflective of a forensic evaluation.   Ms. Fuchs was provided with a verbal description of the nature and purpose of the present neuropsychological evaluation. Also reviewed were the foreseeable risks and/or discomforts and benefits of the procedure,  limits of confidentiality, and mandatory reporting requirements of this provider. The patient was given the opportunity to ask questions and receive answers about the evaluation. Oral consent to participate was provided by the patient.   This evaluation was conducted by Newman Nickels, Ph.D., ABPP-CN, board certified clinical neuropsychologist. Ms. Millward completed a clinical interview with Dr. Milbert Coulter, billed as one unit [phone], and 95 minutes of cognitive testing and scoring, billed as one unit [id number] and two additional units [id number]. Psychometrist Wallace Keller, B.S. assisted Dr. Milbert Coulter with test administration and scoring procedures. As a separate and discrete service, one unit [id number] and two units [id number] were billed for Dr. Tammy Sours time spent in interpretation and report writing.

## 2022-10-15 NOTE — Progress Notes (Signed)
   Psychometrician Note   Cognitive testing was administered to Maria Clay by Wallace Keller, B.S. (psychometrist) under the supervision of Dr. Newman Nickels, Ph.D., licensed psychologist on 10/15/2022. Ms. Gullikson did not appear overtly distressed by the testing session per behavioral observation or responses across self-report questionnaires. Rest breaks were offered.    The battery of tests administered was selected by Dr. Newman Nickels, Ph.D. with consideration to Ms. Pickard's current level of functioning, the nature of her symptoms, emotional and behavioral responses during interview, level of literacy, observed level of motivation/effort, and the nature of the referral question. This battery was communicated to the psychometrist. Communication between Dr. Newman Nickels, Ph.D. and the psychometrist was ongoing throughout the evaluation and Dr. Newman Nickels, Ph.D. was immediately accessible at all times. Dr. Newman Nickels, Ph.D. provided supervision to the psychometrist on the date of this service to the extent necessary to assure the quality of all services provided.    Maria Clay will return within approximately 1-2 weeks for an interactive feedback session with Dr. Milbert Coulter at which time her test performances, clinical impressions, and treatment recommendations will be reviewed in detail. Ms. Trani understands she can contact our office should she require our assistance before this time.  A total of 95 minutes of billable time were spent face-to-face with Ms. Delio by the psychometrist. This includes both test administration and scoring time. Billing for these services is reflected in the clinical report generated by Dr. Newman Nickels, Ph.D.  This note reflects time spent with the psychometrician and does not include test scores or any clinical interpretations made by Dr. Milbert Coulter. The full report will follow in a separate note.

## 2022-10-16 ENCOUNTER — Encounter: Payer: Self-pay | Admitting: Psychology

## 2022-10-22 ENCOUNTER — Encounter: Payer: Medicare HMO | Admitting: Psychology

## 2022-11-08 ENCOUNTER — Ambulatory Visit (INDEPENDENT_AMBULATORY_CARE_PROVIDER_SITE_OTHER): Payer: Medicare HMO | Admitting: Psychology

## 2022-11-08 DIAGNOSIS — F03A0 Unspecified dementia, mild, without behavioral disturbance, psychotic disturbance, mood disturbance, and anxiety: Secondary | ICD-10-CM

## 2022-11-08 NOTE — Progress Notes (Signed)
   Neuropsychology Feedback Session Maria Clay. [facility] Department of Neurology  Reason for Referral:   Maria Clay is a 82 y.o. right-handed Caucasian female referred by Marlowe Kays, PA-C, to characterize her current cognitive functioning and assist with diagnostic clarity and treatment planning in the context of subjective cognitive decline.   Feedback:   Maria Clay completed a comprehensive neuropsychological evaluation on 10/15/2022. Please refer to that encounter for the full report and recommendations. Briefly, results suggested severe impairment surrounding both encoding (i.e., learning) and delayed retrieval aspects of memory. Additional impairments were exhibited across cognitive flexibility and clock drawing, while relative weaknesses were exhibited across semantic fluency and recognition/consolidation aspects of memory. Further performance variability was exhibited across basic attention. The etiology for her somewhat mild dementia presentation is unclear at the present time. With that being said, I do feel that concerns for an underlying neurodegenerative illness, namely Alzheimer's disease, are reasonable. Maria Clay did not benefit from repeated exposure to novel information and was fully amnestic (i.e., 0% retention) across 2/3 memory tasks. She also performed quite poorly across 2/3 yes/no recognition trials. 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 Further weakness surrounding semantic fluency, cognitive flexibility, and clock drawing would follow fairly typical disease progression, escalating concerns. Strong confrontation naming performances are certainly encouraging. While I cannot rule in Alzheimer's disease with strong confidence, this illness certainly cannot be ruled out and there is concern for its presence in earlier stages.  [name] was unavailable during the telephone feedback appointment and it was conducted with her daughter with her implicit permission. Her daughter was within her place of work while I was within my office. I discussed the limitations of evaluation and management by telemedicine and the availability of in person appointments. Her daughter expressed her understanding and agreed to proceed. Content of the current session focused on the results of her neuropsychological evaluation. Maria Clay's daughter was given the opportunity to ask questions and her questions were answered. She was encouraged to reach out should additional questions arise. A copy of her report was mailed at the conclusion of the visit.

## 2022-12-11 ENCOUNTER — Ambulatory Visit: Payer: Medicare HMO | Admitting: Physician Assistant

## 2022-12-11 VITALS — BP 154/94 | HR 63 | Resp 18 | Wt 169.0 lb

## 2022-12-11 DIAGNOSIS — F03A0 Unspecified dementia, mild, without behavioral disturbance, psychotic disturbance, mood disturbance, and anxiety: Secondary | ICD-10-CM

## 2022-12-11 NOTE — Patient Instructions (Signed)
It was a pleasure to see you today at our office.   Recommendations:  Continue B12 Continue  memory meds  Follow up in 6 month  For psychiatric meds, mood meds: Please have your primary care physician manage these medications.  If you have any severe symptoms of a stroke, or other severe issues such as confusion,severe chills or fever, etc call 911 or go to the ER as you may need to be evaluated further   For assessment of decision of mental capacity and competency:  Call Dr. Erick Blinks, geriatric psychiatrist at [phone]  Counseling regarding caregiver distress, including caregiver depression, anxiety and issues regarding community resources, adult day care programs, adult living facilities, or memory care questions:  please contact your  Primary Doctor's Social Worker   Whom to call: Memory  decline, memory medications: Call our office [phone]    [email or web address]   RECOMMENDATIONS FOR ALL PATIENTS WITH MEMORY PROBLEMS: 1. Continue to exercise (Recommend 30 minutes of walking everyday, or 3 hours every week) 2. Increase social interactions - continue going to [place] and enjoy social gatherings with friends and family 3. Eat healthy, avoid fried foods and eat more fruits and vegetables 4. Maintain adequate blood pressure, blood sugar, and blood cholesterol level. Reducing the risk of stroke and cardiovascular disease also helps promoting better memory. 5. Avoid stressful situations. Live a simple life and avoid aggravations. Organize your time and prepare for the next day in anticipation. 6. Sleep well, avoid any interruptions of sleep and avoid any distractions in the bedroom that may interfere with adequate sleep quality 7. Avoid sugar, avoid sweets as there is a strong link between excessive sugar intake, diabetes, and cognitive impairment We discussed the Mediterranean diet, which has been shown to help patients  reduce the risk of progressive memory disorders and reduces cardiovascular risk. This includes eating fish, eat fruits and green leafy vegetables, nuts like almonds and hazelnuts, walnuts, and also use olive oil. Avoid fast foods and fried foods as much as possible. Avoid sweets and sugar as sugar use has been linked to worsening of memory function.  There is always a concern of gradual progression of memory problems. If this is the case, then we may need to adjust level of care according to patient needs. Support, both to the patient and caregiver, should then be put into place.      You have been referred for a neuropsychological evaluation (i.e., evaluation of memory and thinking abilities). Please bring someone with you to this appointment if possible, as it is helpful for the doctor to hear from both you and another adult who knows you well. Please bring eyeglasses and hearing aids if you wear them.    The evaluation will take approximately 3 hours and has two parts:   The first part is a clinical interview with the neuropsychologist (Dr. Milbert Coulter or Dr. Roseanne Reno). During the interview, the neuropsychologist will speak with you and the individual you brought to the appointment.    The second part of the evaluation is testing with the doctor's technician Annabelle Harman or Selena Batten). During the testing, the technician will ask you to remember different types of material, solve problems, and answer some questionnaires. Your family member will not be present for this portion of the evaluation.   Please note: We must reserve several hours of the neuropsychologist's time and the psychometrician's time for your evaluation appointment. As such, there is a No-Show fee of $100. If you are unable to attend any of  your appointments, please contact our office as soon as possible to reschedule.      DRIVING: Regarding driving, in patients with progressive memory problems, driving will be impaired. We advise to have someone  else do the driving if trouble finding directions or if minor accidents are reported. Independent driving assessment is available to determine safety of driving.   If you are interested in the driving assessment, you can contact the following:  [employer] in [place] [phone]  [facility] [phone]  [facility] [phone]  [facility] [phone] or [phone]   FALL PRECAUTIONS: Be cautious when walking. Scan the area for obstacles that may increase the risk of trips and falls. When getting up in the mornings, sit up at the edge of the bed for a few minutes before getting out of bed. Consider elevating the bed at the head end to avoid drop of blood pressure when getting up. Walk always in a well-lit room (use night lights in the walls). Avoid area rugs or power cords from appliances in the middle of the walkways. Use a walker or a cane if necessary and consider physical therapy for balance exercise. Get your eyesight checked regularly.  FINANCIAL OVERSIGHT: Supervision, especially oversight when making financial decisions or transactions is also recommended.  HOME SAFETY: Consider the safety of the kitchen when operating appliances like stoves, microwave oven, and blender. Consider having supervision and share cooking responsibilities until no longer able to participate in those. Accidents with firearms and other hazards in the house should be identified and addressed as well.   ABILITY TO BE LEFT ALONE: If patient is unable to contact 911 operator, consider using LifeLine, or when the need is there, arrange for someone to stay with patients. Smoking is a fire hazard, consider supervision or cessation. Risk of wandering should be assessed by caregiver and if detected at any point, supervision and safe proof recommendations should be instituted.  MEDICATION SUPERVISION: Inability to self-administer medication needs to be constantly  addressed. Implement a mechanism to ensure safe administration of the medications.      Mediterranean Diet A Mediterranean diet refers to food and lifestyle choices that are based on the traditions of countries located on the [employer]. This way of eating has been shown to help prevent certain conditions and improve outcomes for people who have chronic diseases, like kidney disease and heart disease. What are tips for following this plan? Lifestyle  Cook and eat meals together with your family, when possible. Drink enough fluid to keep your urine clear or pale yellow. Be physically active every day. This includes: Aerobic exercise like running or swimming. Leisure activities like gardening, walking, or housework. Get 7-8 hours of sleep each night. If recommended by your health care provider, drink red wine in moderation. This means 1 glass a day for nonpregnant women and 2 glasses a day for men. A glass of wine equals 5 oz (150 mL). Reading food labels  Check the serving size of packaged foods. For foods such as rice and pasta, the serving size refers to the amount of cooked product, not dry. Check the total fat in packaged foods. Avoid foods that have saturated fat or trans fats. Check the ingredients list for added sugars, such as corn syrup. Shopping  At the grocery store, buy most of your food from the areas near the walls of the store. This includes: Fresh fruits and vegetables (produce). Grains, beans, nuts, and seeds. Some of these may be  available in unpackaged forms or large amounts (in bulk). Fresh seafood. Poultry and eggs. Low-fat dairy products. Buy whole ingredients instead of prepackaged foods. Buy fresh fruits and vegetables in-season from local farmers markets. Buy frozen fruits and vegetables in resealable bags. If you do not have access to quality fresh seafood, buy precooked frozen shrimp or canned fish, such as tuna, salmon, or sardines. Buy small amounts  of raw or cooked vegetables, salads, or olives from the deli or salad bar at your store. Stock your pantry so you always have certain foods on hand, such as olive oil, canned tuna, canned tomatoes, rice, pasta, and beans. Cooking  Cook foods with extra-virgin olive oil instead of using butter or other vegetable oils. Have meat as a side dish, and have vegetables or grains as your main dish. This means having meat in small portions or adding small amounts of meat to foods like pasta or stew. Use beans or vegetables instead of meat in common dishes like chili or lasagna. Experiment with different cooking methods. Try roasting or broiling vegetables instead of steaming or sauteing them. Add frozen vegetables to soups, stews, pasta, or rice. Add nuts or seeds for added healthy fat at each meal. You can add these to yogurt, salads, or vegetable dishes. Marinate fish or vegetables using olive oil, lemon juice, garlic, and fresh herbs. Meal planning  Plan to eat 1 vegetarian meal one day each week. Try to work up to 2 vegetarian meals, if possible. Eat seafood 2 or more times a week. Have healthy snacks readily available, such as: Vegetable sticks with hummus. Greek yogurt. Fruit and nut trail mix. Eat balanced meals throughout the week. This includes: Fruit: 2-3 servings a day Vegetables: 4-5 servings a day Low-fat dairy: 2 servings a day Fish, poultry, or lean meat: 1 serving a day Beans and legumes: 2 or more servings a week Nuts and seeds: 1-2 servings a day Whole grains: 6-8 servings a day Extra-virgin olive oil: 3-4 servings a day Limit red meat and sweets to only a few servings a month What are my food choices? Mediterranean diet Recommended Grains: Whole-grain pasta. Brown rice. Bulgar wheat. Polenta. Couscous. Whole-wheat bread. Orpah Cobb. Vegetables: Artichokes. Beets. Broccoli. Cabbage. Carrots. Eggplant. Green beans. Chard. Kale. Spinach. Onions. Leeks. Peas. Squash.  Tomatoes. Peppers. Radishes. Fruits: Apples. Apricots. Avocado. Berries. Bananas. Cherries. Dates. Figs. Grapes. Lemons. Melon. Oranges. Peaches. Plums. Pomegranate. Meats and other protein foods: Beans. Almonds. Sunflower seeds. Pine nuts. Peanuts. Cod. Salmon. Scallops. Shrimp. Tuna. Tilapia. Clams. Oysters. Eggs. Dairy: Low-fat milk. Cheese. Greek yogurt. Beverages: Water. Red wine. Herbal tea. Fats and oils: Extra virgin olive oil. Avocado oil. Grape seed oil. Sweets and desserts: [place] yogurt with honey. Baked apples. Poached pears. Trail mix. Seasoning and other foods: Basil. Cilantro. Coriander. Cumin. Mint. Parsley. Sage. Rosemary. Tarragon. Garlic. Oregano. Thyme. Pepper. Balsalmic vinegar. Tahini. Hummus. Tomato sauce. Olives. Mushrooms. Limit these Grains: Prepackaged pasta or rice dishes. Prepackaged cereal with added sugar. Vegetables: Deep fried potatoes (french fries). Fruits: Fruit canned in syrup. Meats and other protein foods: Beef. Pork. Lamb. Poultry with skin. Hot dogs. Tomasa Blase. Dairy: Ice cream. Sour cream. Whole milk. Beverages: Juice. Sugar-sweetened soft drinks. Beer. Liquor and spirits. Fats and oils: Butter. Canola oil. Vegetable oil. Beef fat (tallow). Lard. Sweets and desserts: Cookies. Cakes. Pies. Candy. Seasoning and other foods: Mayonnaise. Premade sauces and marinades. The items listed may not be a complete list. Talk with your dietitian about what dietary choices are right for you. Summary The  Mediterranean diet includes both food and lifestyle choices. Eat a variety of fresh fruits and vegetables, beans, nuts, seeds, and whole grains. Limit the amount of red meat and sweets that you eat. Talk with your health care provider about whether it is safe for you to drink red wine in moderation. This means 1 glass a day for nonpregnant women and 2 glasses a day for men. A glass of wine equals 5 oz (150 mL). This information is not intended to replace advice given to  you by your health care provider. Make sure you discuss any questions you have with your health care provider. Document Released: 08/18/2015 Document Revised: 09/20/2015 Document Reviewed: 08/18/2015 Elsevier Interactive Patient Education  2017 [employer].    Labs today [address] [facility] [id number] MRI

## 2022-12-11 NOTE — Progress Notes (Signed)
Assessment/Plan:   Mild Dementia, concern for Alzheimer's disease    Maria Clay is a very pleasant 82 y.o. RH female with a history of hypertension, hyperlipidemia, DM2 with neuropathy and retinopathy, COPD, PAD, depression, IBS, OAB  and a diagnosis of mild dementia of unclear etiology, but with concern for Alzheimer's disease early stages per neuropsych evaluation on 10/15/2022,seen today in follow up for memory loss. Patient is currently on rivastigmine 3 mg bid as per PCP. Memory is stable, but she may bot be compliant with the medication. Discuss with her daughter the possibility of having a sitter to help with meds, monitoring for safety. She agreed. Mood is stable. Patient is able to participate on his ADLs and to drive short distances.     Follow up in 6  months. Repeat neuropsych evaluation in 12 to 18 months for clarity of the diagnosis and trajectory Continue rivastigmine 3 mg bid. as per primary doctor, side effects discussed Replenish B12 (323) is Recommend good control of her cardiovascular risk factors Continue to control mood as per PCP Monitor driving    Subjective:    This patient is accompanied in the office by her daughter who supplements the history.  Previous records as well as any outside records available were reviewed prior to todays visit. Patient was last seen on  08/07/22 with MMSE 18/30   Any changes in memory since last visit? "About the same"-she says. Daughter is concerned that she is not taking her medicines.  He continues to have difficulty remembering recent conversations and names of people she meets, new information.  She likes to read extensively as before, she does not enjoy doing brain games. repeats oneself?  Endorsed Disoriented when walking into a room?  Patient denies   Leaving objects?  May misplace things but not in unusual places  Wandering behavior?  denies   Any personality changes since last visit?  denies   Any worsening  depression?:  Denies.   Hallucinations or paranoia?  Denies.   Seizures? denies    Any sleep changes?  Sleeps well . Denies vivid dreams, REM behavior or sleepwalking   Sleep apnea?   Denies.   Any hygiene concerns? Denies.  Independent of bathing and dressing?  Endorsed  Does the patient needs help with medications?  Daughter is in charge  Who is in charge of the finances?  Daughter and son are in charge    Any changes in appetite? She does not eat well, "not hungry".  She is to try protein drinks . She admits to not drinking enough water.   Patient have trouble swallowing? Denies.   Does the patient cook?  Yes, she forgot to make mac and cheese.  She denies any kitchen accidents. Any headaches?   denies   Chronic back pain  denies   Ambulates with difficulty? Denies.  Uses a cane to ambulate for stability. Recent falls or head injuries? denies     Unilateral weakness, numbness or tingling? denies   Any tremors?  Denies   Any anosmia?  Denies   Any incontinence of urine?  Endorsed due to OAB, wears diapers Any bowel dysfunction?Not often she may have an episode of loose stools    Patient lives alone family monitors  Does the patient drive?  Continues to drive, denies any issues, denies getting lost but daughter disagrees "there were a couple of episodes that she was confused how to get to destination" .     Initial visit 08/07/2022 How  long did patient have memory difficulties?  Gradually , for at least 3 years . Patient has difficulty remembering recent conversations and people names, new information.She began taking donepezil less than 1 y ago but " the memory was worsening with the medicine". This was changed to rivastigmine, and it seems that "memory is not getting worse". She likes to read extensively Has not been doing brain games repeats oneself?  Endorsed Disoriented when walking into a room?  Denies   Leaving objects in unusual places?   denies   Wandering behavior? denies    Any personality changes ? denies   Any history of depression?: denies   Hallucinations or paranoia?  denies   Seizures? denies    Any sleep changes?  No sleep changes. Denies  vivid dreams, REM behavior or sleepwalking   Sleep apnea? denies   Any hygiene concerns?  denies   Independent of bathing and dressing?  Endorsed  Does the patient need help with medications? Daughter is in charge for the last 3 months because she may have forgotten doses.  Who is in charge of the finances? Daughter and son  is in charge     Any changes in appetite?  "I don't know what I ate or if I ate". She admits to not drinking enough water  Patient have trouble swallowing?  denies   Does the patient cook?  Yes, denies accidents Any headaches?  denies   Chronic back pain?  denies   Ambulates with difficulty? Needs  a cane to ambulate    Recent falls or head injuries? In April 2024 she had a mechanical fall with the carpet. She had ankle fracture , now fully recovered   Vision changes? No  Stroke like symptoms?  denies   Any tremors?  denies   Any anosmia?  denies   Any incontinence of urine? Endorsed due to OAB , uses Depends   Any bowel dysfunction? denies      [place] History of heavy alcohol intake? denies   History of heavy tobacco use? denies   Family history of dementia?  Mo had dementia ? Type  Does patient drive? 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    Neuropsych evaluation 10/15/2022 Briefly, results suggested severe impairment surrounding both encoding (i.e., learning) and delayed retrieval aspects of memory. Additional impairments were exhibited across cognitive flexibility and clock drawing, while relative weaknesses were exhibited across semantic  fluency and recognition/consolidation aspects of memory. Further performance variability was exhibited across basic attention. The etiology for her somewhat mild dementia presentation is unclear at the present time. With that being said, I do feel that concerns for an underlying neurodegenerative illness, namely Alzheimer's disease, are reasonable. Ms. Czechowski did not benefit from repeated exposure to novel information and was fully amnestic (i.e., 0% retention) across 2/3 memory tasks. She also performed quite poorly across 2/3 yes/no recognition trials. 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 Further weakness surrounding semantic fluency, cognitive flexibility, and clock drawing would follow fairly typical disease progression, escalating concerns. Strong confrontation naming performances are certainly encouraging. While I cannot rule in Alzheimer's disease with strong confidence, this illness certainly cannot be ruled  out and there is concern for its presence in earlier stages.  PREVIOUS MEDICATIONS:   CURRENT MEDICATIONS:  Outpatient Encounter Medications as of 12/11/2022  Medication Sig   albuterol (VENTOLIN HFA) 108 (90 Base) MCG/ACT inhaler Inhale into the lungs.   amitriptyline (ELAVIL) 25 MG tablet 25 mg.   aspirin EC 81 MG tablet One half of a baby aspirin daily as able to be tolerated.   Blood Glucose Monitoring Suppl (TGT BLOOD GLUCOSE MONITORING) w/Device KIT Dispense 1 onetouch verio flex glucometer   gabapentin (NEURONTIN) 300 MG capsule Take 300 mg by mouth at bedtime.   lidocaine (LIDODERM) 5 % Place 1 patch onto the skin daily as needed. Apply patch to area most significant pain once per day.  Remove and discard patch within 12 hours of application.   No facility-administered encounter medications on file as of 12/11/2022.        No data to display             08/07/2022    8:00 PM  Montreal Cognitive Assessment   Visuospatial/ Executive (0/5) 2  Naming (0/3) 3  Attention: Read list of digits (0/2) 2  Attention: Read list of letters (0/1) 1  Attention: Serial 7 subtraction starting at 100 (0/3) 1  Language: Repeat phrase (0/2) 1  Language : Fluency (0/1) 1  Abstraction (0/2) 2  Delayed Recall (0/5) 0  Orientation (0/6) 4  Total 17  Adjusted Score (based on education) 18    Objective:     PHYSICAL EXAMINATION:    VITALS:   Vitals:   12/11/22 1507  BP: (!) 154/94  Pulse: 63  Resp: 18  Weight: 169 lb (76.7 kg)    GEN:  The patient appears stated age and is in NAD. HEENT:  Normocephalic, atraumatic.   Neurological examination:  General: NAD, well-groomed, appears stated age. Orientation: The patient is alert. Oriented to person, place and not to date Cranial nerves: There is good facial symmetry.The speech is fluent and clear. No aphasia or dysarthria. Fund of knowledge is appropriate. Recent and remote memory are impaired. Attention and concentration are reduced.  Able to name objects and repeat phrases.  Hearing is intact to conversational tone.  Sensation: Sensation is intact to light touch throughout Motor: Strength is at least antigravity x4. DTR's 2/4 in UE/LE     Movement examination: Tone: There is normal tone in the UE/LE Abnormal movements:  no tremor.  No myoclonus.  No asterixis.   Coordination:  There is no decremation with RAM's. Normal finger to nose  Gait and Station: The patient has no difficulty arising out of a deep-seated chair without the use of the hands. Uses a R cane. The patient's stride length is good.  Gait is cautious and narrow.    Thank you for allowing [place] the opportunity to participate in the care of this nice patient. Please do not hesitate to contact us for any questions or concerns.   Total time spent on today's visit was 44 minutes dedicated to this patient today, preparing to see patient,  examining the patient, ordering tests and/or medications and counseling the patient, documenting clinical information in the EHR or other health record, independently interpreting results and communicating results to the patient/family, discussing treatment and goals, answering patient's questions and coordinating care.  Cc:  Rosita Fire, NP  Marlowe Kays 12/11/2022 4:29 PM

## 2023-05-24 ENCOUNTER — Ambulatory Visit: Payer: Self-pay

## 2023-05-24 ENCOUNTER — Institutional Professional Consult (permissible substitution): Payer: Medicare HMO | Admitting: Psychology

## 2023-05-31 ENCOUNTER — Encounter: Payer: Medicare HMO | Admitting: Psychology

## 2023-06-05 ENCOUNTER — Inpatient Hospital Stay (HOSPITAL_COMMUNITY)
Admission: EM | Admit: 2023-06-05 | Discharge: 2023-06-08 | DRG: 392 | Disposition: A | Discharge status: Home / Self Care | Attending: Internal Medicine | Admitting: Internal Medicine

## 2023-06-05 ENCOUNTER — Encounter (HOSPITAL_COMMUNITY): Payer: Self-pay | Admitting: Emergency Medicine

## 2023-06-05 ENCOUNTER — Other Ambulatory Visit: Payer: Self-pay

## 2023-06-05 DIAGNOSIS — R112 Nausea with vomiting, unspecified: Principal | ICD-10-CM

## 2023-06-05 DIAGNOSIS — R1084 Generalized abdominal pain: Secondary | ICD-10-CM | POA: Diagnosis not present

## 2023-06-05 DIAGNOSIS — E876 Hypokalemia: Secondary | ICD-10-CM | POA: Diagnosis not present

## 2023-06-05 DIAGNOSIS — R4189 Other symptoms and signs involving cognitive functions and awareness: Secondary | ICD-10-CM | POA: Diagnosis present

## 2023-06-05 DIAGNOSIS — F03A0 Unspecified dementia, mild, without behavioral disturbance, psychotic disturbance, mood disturbance, and anxiety: Secondary | ICD-10-CM | POA: Diagnosis present

## 2023-06-05 DIAGNOSIS — Z79899 Other long term (current) drug therapy: Secondary | ICD-10-CM

## 2023-06-05 DIAGNOSIS — I10 Essential (primary) hypertension: Secondary | ICD-10-CM | POA: Diagnosis present

## 2023-06-05 DIAGNOSIS — E782 Mixed hyperlipidemia: Secondary | ICD-10-CM | POA: Diagnosis present

## 2023-06-05 DIAGNOSIS — K257 Chronic gastric ulcer without hemorrhage or perforation: Secondary | ICD-10-CM | POA: Diagnosis present

## 2023-06-05 DIAGNOSIS — E1165 Type 2 diabetes mellitus with hyperglycemia: Secondary | ICD-10-CM | POA: Diagnosis present

## 2023-06-05 DIAGNOSIS — F411 Generalized anxiety disorder: Secondary | ICD-10-CM | POA: Diagnosis present

## 2023-06-05 DIAGNOSIS — G2581 Restless legs syndrome: Secondary | ICD-10-CM | POA: Diagnosis present

## 2023-06-05 DIAGNOSIS — K922 Gastrointestinal hemorrhage, unspecified: Principal | ICD-10-CM

## 2023-06-05 DIAGNOSIS — Z87891 Personal history of nicotine dependence: Secondary | ICD-10-CM

## 2023-06-05 DIAGNOSIS — Z7982 Long term (current) use of aspirin: Secondary | ICD-10-CM

## 2023-06-05 DIAGNOSIS — E1143 Type 2 diabetes mellitus with diabetic autonomic (poly)neuropathy: Secondary | ICD-10-CM | POA: Diagnosis present

## 2023-06-05 DIAGNOSIS — K2100 Gastro-esophageal reflux disease with esophagitis, without bleeding: Secondary | ICD-10-CM | POA: Diagnosis not present

## 2023-06-05 DIAGNOSIS — Z981 Arthrodesis status: Secondary | ICD-10-CM

## 2023-06-05 DIAGNOSIS — D72829 Elevated white blood cell count, unspecified: Secondary | ICD-10-CM | POA: Diagnosis present

## 2023-06-05 DIAGNOSIS — K3184 Gastroparesis: Secondary | ICD-10-CM | POA: Diagnosis present

## 2023-06-05 DIAGNOSIS — Z888 Allergy status to other drugs, medicaments and biological substances status: Secondary | ICD-10-CM

## 2023-06-05 DIAGNOSIS — J449 Chronic obstructive pulmonary disease, unspecified: Secondary | ICD-10-CM | POA: Diagnosis present

## 2023-06-05 DIAGNOSIS — E1142 Type 2 diabetes mellitus with diabetic polyneuropathy: Secondary | ICD-10-CM | POA: Diagnosis present

## 2023-06-05 DIAGNOSIS — Z885 Allergy status to narcotic agent status: Secondary | ICD-10-CM

## 2023-06-05 LAB — CBC WITH DIFFERENTIAL/PLATELET
Abs Immature Granulocytes: 0.06 10*3/uL (ref 0.00–0.07)
Basophils Absolute: 0 10*3/uL (ref 0.0–0.1)
Basophils Relative: 0 %
Eosinophils Absolute: 0 10*3/uL (ref 0.0–0.5)
Eosinophils Relative: 0 %
HCT: 47.6 % — ABNORMAL HIGH (ref 36.0–46.0)
Hemoglobin: 15.5 g/dL — ABNORMAL HIGH (ref 12.0–15.0)
Immature Granulocytes: 0 %
Lymphocytes Relative: 5 %
Lymphs Abs: 0.7 10*3/uL (ref 0.7–4.0)
MCH: 27 pg (ref 26.0–34.0)
MCHC: 32.6 g/dL (ref 30.0–36.0)
MCV: 82.8 fL (ref 80.0–100.0)
Monocytes Absolute: 0.6 10*3/uL (ref 0.1–1.0)
Monocytes Relative: 4 %
Neutro Abs: 13 10*3/uL — ABNORMAL HIGH (ref 1.7–7.7)
Neutrophils Relative %: 91 %
Platelets: 381 10*3/uL (ref 150–400)
RBC: 5.75 MIL/uL — ABNORMAL HIGH (ref 3.87–5.11)
RDW: 14.9 % (ref 11.5–15.5)
WBC: 14.4 10*3/uL — ABNORMAL HIGH (ref 4.0–10.5)
nRBC: 0 % (ref 0.0–0.2)

## 2023-06-05 LAB — COMPREHENSIVE METABOLIC PANEL WITH GFR
ALT: 14 U/L (ref 0–44)
AST: 19 U/L (ref 15–41)
Albumin: 3.7 g/dL (ref 3.5–5.0)
Alkaline Phosphatase: 70 U/L (ref 38–126)
Anion gap: 15 (ref 5–15)
BUN: 22 mg/dL (ref 8–23)
CO2: 28 mmol/L (ref 22–32)
Calcium: 9.6 mg/dL (ref 8.9–10.3)
Chloride: 88 mmol/L — ABNORMAL LOW (ref 98–111)
Creatinine, Ser: 1 mg/dL (ref 0.44–1.00)
GFR, Estimated: 57 mL/min — ABNORMAL LOW (ref >60)
Glucose, Bld: 250 mg/dL — ABNORMAL HIGH (ref 70–99)
Potassium: 3.7 mmol/L (ref 3.5–5.1)
Sodium: 131 mmol/L — ABNORMAL LOW (ref 135–145)
Total Bilirubin: 0.7 mg/dL (ref 0.0–1.2)
Total Protein: 7.1 g/dL (ref 6.5–8.1)

## 2023-06-05 LAB — LIPASE, BLOOD: Lipase: 26 U/L (ref 11–51)

## 2023-06-05 MED ORDER — ONDANSETRON HCL 4 MG/2ML IJ SOLN
4.0000 mg | Freq: Once | INTRAMUSCULAR | Status: AC
Start: 2023-06-05 — End: 2023-06-05
  Administered 2023-06-05: 4 mg via INTRAVENOUS
  Filled 2023-06-05: qty 2

## 2023-06-05 MED ORDER — SODIUM CHLORIDE 0.9 % IV BOLUS
1000.0000 mL | Freq: Once | INTRAVENOUS | Status: AC
  Administered 2023-06-05: 1000 mL via INTRAVENOUS

## 2023-06-05 MED ORDER — METOCLOPRAMIDE HCL 5 MG/ML IJ SOLN
10.0000 mg | Freq: Once | INTRAMUSCULAR | Status: AC
  Administered 2023-06-06: 10 mg via INTRAVENOUS
  Filled 2023-06-05: qty 2

## 2023-06-05 NOTE — ED Triage Notes (Signed)
 Pt BIB EMS from home with N/V that started this AM. Emesis dark in color.   204/90 100HR 97RA Cbg 300   4mg  zofran LR

## 2023-06-06 ENCOUNTER — Encounter (HOSPITAL_COMMUNITY)
Admission: EM | Disposition: A | Discharge status: Home / Self Care | Payer: Self-pay | Source: Home / Self Care | Attending: Internal Medicine

## 2023-06-06 ENCOUNTER — Emergency Department (HOSPITAL_COMMUNITY)

## 2023-06-06 DIAGNOSIS — D72829 Elevated white blood cell count, unspecified: Secondary | ICD-10-CM

## 2023-06-06 DIAGNOSIS — R112 Nausea with vomiting, unspecified: Secondary | ICD-10-CM | POA: Diagnosis present

## 2023-06-06 DIAGNOSIS — K920 Hematemesis: Secondary | ICD-10-CM | POA: Diagnosis not present

## 2023-06-06 DIAGNOSIS — Z87891 Personal history of nicotine dependence: Secondary | ICD-10-CM | POA: Diagnosis not present

## 2023-06-06 DIAGNOSIS — K3184 Gastroparesis: Secondary | ICD-10-CM | POA: Diagnosis present

## 2023-06-06 DIAGNOSIS — E876 Hypokalemia: Secondary | ICD-10-CM | POA: Diagnosis not present

## 2023-06-06 DIAGNOSIS — K2100 Gastro-esophageal reflux disease with esophagitis, without bleeding: Secondary | ICD-10-CM | POA: Diagnosis not present

## 2023-06-06 DIAGNOSIS — E1165 Type 2 diabetes mellitus with hyperglycemia: Secondary | ICD-10-CM | POA: Diagnosis present

## 2023-06-06 DIAGNOSIS — G2581 Restless legs syndrome: Secondary | ICD-10-CM | POA: Diagnosis present

## 2023-06-06 DIAGNOSIS — E782 Mixed hyperlipidemia: Secondary | ICD-10-CM | POA: Diagnosis present

## 2023-06-06 DIAGNOSIS — E1142 Type 2 diabetes mellitus with diabetic polyneuropathy: Secondary | ICD-10-CM | POA: Diagnosis present

## 2023-06-06 DIAGNOSIS — Z79899 Other long term (current) drug therapy: Secondary | ICD-10-CM | POA: Diagnosis not present

## 2023-06-06 DIAGNOSIS — K219 Gastro-esophageal reflux disease without esophagitis: Secondary | ICD-10-CM

## 2023-06-06 DIAGNOSIS — F411 Generalized anxiety disorder: Secondary | ICD-10-CM | POA: Diagnosis present

## 2023-06-06 DIAGNOSIS — F03A0 Unspecified dementia, mild, without behavioral disturbance, psychotic disturbance, mood disturbance, and anxiety: Secondary | ICD-10-CM | POA: Diagnosis present

## 2023-06-06 DIAGNOSIS — K922 Gastrointestinal hemorrhage, unspecified: Principal | ICD-10-CM | POA: Diagnosis present

## 2023-06-06 DIAGNOSIS — Z885 Allergy status to narcotic agent status: Secondary | ICD-10-CM | POA: Diagnosis not present

## 2023-06-06 DIAGNOSIS — J449 Chronic obstructive pulmonary disease, unspecified: Secondary | ICD-10-CM | POA: Diagnosis present

## 2023-06-06 DIAGNOSIS — Z981 Arthrodesis status: Secondary | ICD-10-CM | POA: Diagnosis not present

## 2023-06-06 DIAGNOSIS — I10 Essential (primary) hypertension: Secondary | ICD-10-CM | POA: Diagnosis present

## 2023-06-06 DIAGNOSIS — Z7982 Long term (current) use of aspirin: Secondary | ICD-10-CM | POA: Diagnosis not present

## 2023-06-06 DIAGNOSIS — R1110 Vomiting, unspecified: Secondary | ICD-10-CM | POA: Diagnosis not present

## 2023-06-06 DIAGNOSIS — R933 Abnormal findings on diagnostic imaging of other parts of digestive tract: Secondary | ICD-10-CM | POA: Diagnosis not present

## 2023-06-06 DIAGNOSIS — R4189 Other symptoms and signs involving cognitive functions and awareness: Secondary | ICD-10-CM | POA: Diagnosis present

## 2023-06-06 DIAGNOSIS — K449 Diaphragmatic hernia without obstruction or gangrene: Secondary | ICD-10-CM | POA: Diagnosis not present

## 2023-06-06 DIAGNOSIS — E1143 Type 2 diabetes mellitus with diabetic autonomic (poly)neuropathy: Secondary | ICD-10-CM | POA: Diagnosis present

## 2023-06-06 DIAGNOSIS — R1084 Generalized abdominal pain: Secondary | ICD-10-CM | POA: Diagnosis present

## 2023-06-06 DIAGNOSIS — Z888 Allergy status to other drugs, medicaments and biological substances status: Secondary | ICD-10-CM | POA: Diagnosis not present

## 2023-06-06 DIAGNOSIS — K257 Chronic gastric ulcer without hemorrhage or perforation: Secondary | ICD-10-CM | POA: Diagnosis present

## 2023-06-06 LAB — BASIC METABOLIC PANEL WITH GFR
Anion gap: 11 (ref 5–15)
BUN: 19 mg/dL (ref 8–23)
CO2: 30 mmol/L (ref 22–32)
Calcium: 9 mg/dL (ref 8.9–10.3)
Chloride: 95 mmol/L — ABNORMAL LOW (ref 98–111)
Creatinine, Ser: 0.84 mg/dL (ref 0.44–1.00)
GFR, Estimated: >60 mL/min (ref >60)
Glucose, Bld: 164 mg/dL — ABNORMAL HIGH (ref 70–99)
Potassium: 3.5 mmol/L (ref 3.5–5.1)
Sodium: 136 mmol/L (ref 135–145)

## 2023-06-06 LAB — URINALYSIS, ROUTINE W REFLEX MICROSCOPIC
Bacteria, UA: NONE SEEN
Bilirubin Urine: NEGATIVE
Glucose, UA: 50 mg/dL — AB
Ketones, ur: 20 mg/dL — AB
Leukocytes,Ua: NEGATIVE
Nitrite: NEGATIVE
Protein, ur: 100 mg/dL — AB
Specific Gravity, Urine: >1.046 — ABNORMAL HIGH (ref 1.005–1.030)
pH: 5 (ref 5.0–8.0)

## 2023-06-06 LAB — MAGNESIUM: Magnesium: 2 mg/dL (ref 1.7–2.4)

## 2023-06-06 LAB — CBG MONITORING, ED
Glucose-Capillary: 170 mg/dL — ABNORMAL HIGH (ref 70–99)
Glucose-Capillary: 142 mg/dL — ABNORMAL HIGH (ref 70–99)
Glucose-Capillary: 148 mg/dL — ABNORMAL HIGH (ref 70–99)

## 2023-06-06 LAB — GLUCOSE, CAPILLARY
Glucose-Capillary: 155 mg/dL — ABNORMAL HIGH (ref 70–99)
Glucose-Capillary: 129 mg/dL — ABNORMAL HIGH (ref 70–99)
Glucose-Capillary: 129 mg/dL — ABNORMAL HIGH (ref 70–99)

## 2023-06-06 LAB — CBC
HCT: 44.7 % (ref 36.0–46.0)
Hemoglobin: 14.6 g/dL (ref 12.0–15.0)
MCH: 26.8 pg (ref 26.0–34.0)
MCHC: 32.7 g/dL (ref 30.0–36.0)
MCV: 82.2 fL (ref 80.0–100.0)
Platelets: 339 10*3/uL (ref 150–400)
RBC: 5.44 MIL/uL — ABNORMAL HIGH (ref 3.87–5.11)
RDW: 14.9 % (ref 11.5–15.5)
WBC: 14 10*3/uL — ABNORMAL HIGH (ref 4.0–10.5)
nRBC: 0 % (ref 0.0–0.2)

## 2023-06-06 LAB — HEMOGLOBIN A1C
Hgb A1c MFr Bld: 6.9 % — ABNORMAL HIGH (ref 4.8–5.6)
Mean Plasma Glucose: 151.33 mg/dL

## 2023-06-06 LAB — PHOSPHORUS: Phosphorus: 3.7 mg/dL (ref 2.5–4.6)

## 2023-06-06 SURGERY — EGD (ESOPHAGOGASTRODUODENOSCOPY)
Anesthesia: Monitor Anesthesia Care

## 2023-06-06 MED ORDER — MELATONIN 5 MG PO TABS
5.0000 mg | ORAL_TABLET | Freq: Every evening | ORAL | Status: DC | PRN
  Administered 2023-06-06: 5 mg via ORAL
  Filled 2023-06-06: qty 1

## 2023-06-06 MED ORDER — ACETAMINOPHEN 325 MG PO TABS: 650.0000 mg | ORAL_TABLET | Freq: Four times a day (QID) | ORAL | Status: DC | PRN

## 2023-06-06 MED ORDER — IOHEXOL 350 MG/ML SOLN
75.0000 mL | Freq: Once | INTRAVENOUS | Status: AC | PRN
  Administered 2023-06-06: 75 mL via INTRAVENOUS

## 2023-06-06 MED ORDER — INSULIN ASPART 100 UNIT/ML IJ SOLN
0.0000 [IU] | INTRAMUSCULAR | Status: DC
  Administered 2023-06-06 (×2): 2 [IU] via SUBCUTANEOUS
  Administered 2023-06-06 (×2): 1 [IU] via SUBCUTANEOUS
  Administered 2023-06-07: 2 [IU] via SUBCUTANEOUS
  Administered 2023-06-07 (×2): 1 [IU] via SUBCUTANEOUS
  Administered 2023-06-07: 3 [IU] via SUBCUTANEOUS
  Administered 2023-06-08: 1 [IU] via SUBCUTANEOUS
  Administered 2023-06-08: 2 [IU] via SUBCUTANEOUS

## 2023-06-06 MED ORDER — SODIUM CHLORIDE 0.9 % IV SOLN: INTRAVENOUS | Status: AC

## 2023-06-06 MED ORDER — POLYETHYLENE GLYCOL 3350 17 G PO PACK: 17.0000 g | PACK | Freq: Every day | ORAL | Status: DC | PRN

## 2023-06-06 MED ORDER — PROCHLORPERAZINE EDISYLATE 10 MG/2ML IJ SOLN
5.0000 mg | Freq: Four times a day (QID) | INTRAMUSCULAR | Status: DC | PRN
  Administered 2023-06-06 (×2): 5 mg via INTRAVENOUS
  Filled 2023-06-06 (×2): qty 2

## 2023-06-06 MED ORDER — PANTOPRAZOLE SODIUM 40 MG IV SOLR
40.0000 mg | Freq: Two times a day (BID) | INTRAVENOUS | Status: DC
  Administered 2023-06-06 – 2023-06-07 (×3): 40 mg via INTRAVENOUS
  Filled 2023-06-06 (×3): qty 10

## 2023-06-06 MED ORDER — PANTOPRAZOLE SODIUM 40 MG IV SOLR
40.0000 mg | Freq: Once | INTRAVENOUS | Status: AC
  Administered 2023-06-06: 40 mg via INTRAVENOUS
  Filled 2023-06-06: qty 10

## 2023-06-06 MED ORDER — ONDANSETRON HCL 4 MG/2ML IJ SOLN
4.0000 mg | Freq: Four times a day (QID) | INTRAMUSCULAR | Status: DC | PRN
  Administered 2023-06-06: 4 mg via INTRAVENOUS
  Filled 2023-06-06 (×2): qty 2

## 2023-06-06 NOTE — Care Management (Signed)
  Transition of Care [facility]) Screening Note   Patient Details  Name: Maria Clay Date of Birth: 1940/12/10   [facility] of Care [facility] CM/SW Contact:    Ronni Colace, RN Phone Number: 06/06/2023, 1:54 PM    Transition of Care Department [facility]) has reviewed patient and no TOC needs have been identified at this time. We will continue to monitor patient advancement through interdisciplinary progression rounds. If new patient transition needs arise, please place a TOC consult.

## 2023-06-06 NOTE — Consult Note (Addendum)
 Consultation Note   Referring Provider:  [facility] PCP: [facility], [place] Primary Gastroenterologist:   [facility]    Reason for Consultation:  hematemesis DOA: 06/05/2023         Hospital Day: 2   ASSESSMENT    83 yo female with a history of GERD, Nissen Fundoplication with recurrent hiatal hernia and gastroparesis ( recently well managed with small food portions) being admitted with acute vomiting / coffee ground emesis and probable dehydration.   She has leukocytosis which could be due in part to hemoconcentration but hasn't improved with fluid resuscitation.  May have gastroenteritis. Also consider PUD. CT scan shows small hiatal hernia and gastroesophageal wall thickening but no acute findings. Coffee ground emesis may be 2/2 to erosive esophagitis in setting of untreated reflux.    Reported dementia  See PMH for additional history    PLAN:   --Monitor H/H --Continue BID IV pantoprazole --Continue prn anti-emetics --Keep NPO --EGD today The risks and benefits of EGD with possible biopsies were discussed with the patient who agrees to proceed.   HPI   83 y.o. year old female with a medical history including but not limited to DM2 with polyneuropathy, PUD, gastroparesis, GERD, history of Nissen fundoplication, periampullary duodenal diverticulum, PUD, colonic diverticulosis, reported dementia, emphysema  Patient presented to ED yesterday with nausea, vomiting and possible hematemesis. In ED she was tachycardic, appeared volume depleted with HCT of 47.6.   WBC was elevated at 14.  LFTs, lipase normal. CT scan showed a persistent small volume hiatal hernia with gastroesophageal wall thickening. After IVF her hgb today is normal at 14.6. WBC still elevated at 14.  Mariposa has dementia, daughter in room and helps with history .   Patient has gastroparesis but has been eating smaller portions and not been bothered by  by nausea / vomiting for several months.  However, yesterday she began having nausea and vomiting. Emesis was dark like coffee grounds per daughter. The daughter isn't clear when the vomiting started yesterday nor how many episodes of vomiting / dark emesis occurred.   Per daughter, Kenza has not been taking prescribed PPI because she hasn't had any GERD symptoms.   Labs and Imaging:  Recent Labs    06/05/23 2253  PROT 7.1  ALBUMIN 3.7  AST 19  ALT 14  ALKPHOS 70  BILITOT 0.7   Recent Labs    06/05/23 2253 06/06/23 0420  WBC 14.4* 14.0*  HGB 15.5* 14.6  HCT 47.6* 44.7  MCV 82.8 82.2  PLT 381 339   Recent Labs    06/05/23 2253 06/06/23 0420  NA 131* 136  K 3.7 3.5  CL 88* 95*  CO2 28 30  GLUCOSE 250* 164*  BUN 22 19  CREATININE 1.00 0.84  CALCIUM 9.6 9.0     CT ABDOMEN PELVIS W CONTRAST CLINICAL DATA:  Abdominal pain, acute, nonlocalized.  Vomiting  EXAM: CT ABDOMEN AND PELVIS WITH CONTRAST  TECHNIQUE: Multidetector CT imaging of the abdomen and pelvis was performed using the standard protocol following bolus administration of intravenous contrast.  RADIATION DOSE REDUCTION: This exam was performed according to the departmental dose-optimization program which includes automated exposure control, adjustment of the mA and/or kV  according to patient size and/or use of iterative reconstruction technique.  CONTRAST:  75mL OMNIPAQUE  IOHEXOL  350 MG/ML SOLN  COMPARISON:  CT abdomen pelvis 08/18/2018 trauma PET CT 02/13/2023  FINDINGS: Lower chest: Persistent at small volume hiatal hernia. Gastroesophageal wall thickening.  Hepatobiliary: No focal liver abnormality. No gallstones, gallbladder wall thickening, or pericholecystic fluid. No biliary dilatation.  Pancreas: No focal lesion. Normal pancreatic contour. No surrounding inflammatory changes. No main pancreatic ductal dilatation.  Spleen: Normal in size without focal abnormality.  Adrenals/Urinary  Tract:  No adrenal nodule bilaterally.  Bilateral kidneys enhance symmetrically. Parapelvic right simple cyst. Simple renal cysts, in the absence of clinically indicated signs/symptoms, require no independent follow-up.  No hydronephrosis. No hydroureter.  No nephroureterolithiasis.  The urinary bladder is unremarkable.  On delayed imaging, there is no urothelial wall thickening and there are no filling defects in the opacified portions of the bilateral collecting systems or ureters.  Stomach/Bowel: Stomach is within normal limits. No evidence of bowel wall thickening or dilatation. Second portion of the duodenum diverticula. Colonic diverticulosis. Appendix appears normal.  Vascular/Lymphatic: No abdominal aorta or iliac aneurysm. Severe atherosclerotic plaque of the aorta and its branches. No abdominal, pelvic, or inguinal lymphadenopathy.  Reproductive: Status post hysterectomy. No adnexal masses.  Other: No intraperitoneal free fluid. No intraperitoneal free gas. No organized fluid collection.  Musculoskeletal:  No abdominal wall hernia or abnormality.  No suspicious lytic or blastic osseous lesions. No acute displaced fracture. Grade 1 anterolisthesis of L4 on L5.  IMPRESSION: 1. Persistent at small volume hiatal hernia with gastroesophageal wall thickening. Correlate with signs and symptoms of esophagitis. Underlying mass not excluded. Consider direct visualization. 2. Colonic diverticulosis with no acute diverticulitis. 3.  Aortic Atherosclerosis (ICD10-I70.0).  Electronically Signed   By: Morgane  Naveau M.D.   On: 06/06/2023 00:43   Past Medical History:  Diagnosis Date   Abdominal distention 08/14/2018   Acute left ankle pain 05/09/2022   Acute non-recurrent pansinusitis 03/07/2021   Last Assessment & Plan:    Acute, uncontrolled, given timeline will treat with antibiotics   - start augmentin   - message back in 1 week, if only mild improvement will  consider addition of singulair 10mg  nightly, SE already discussed   - referral to allergy as per patient request     Age-related osteoporosis without current pathological fracture 07/18/2013   Agoraphobia 09/17/2017   Chronic, intermittent  - patient on low dose klonopin as needed  - does not take daily  - discussed risks and benefits of med  - will give daily prn and see how long this lasts  - no refill needed today  - patient compliant  - Reviewed [place] controlled substances registry     Allergic rhinitis 05/04/2015   Asymptomatic premature ventricular contractions 07/27/2011   Benign essential hypertension 07/27/2011   Last Assessment & Plan:    Chronic, well controlled, goal < 130/80   - cont current management   - cont walking     Callus of foot 06/21/2020   Cardiac conduction disorder 07/18/2013   Chronic gastric ulcer 07/18/2013   Chronic obstructive pulmonary disease (COPD) 07/18/2013   Closed fracture of lateral malleolus of left ankle 04/18/2022   Cobalamin deficiency 07/18/2013   Colitis 12/11/2021   Combined forms of age-related cataract of both eyes 09/20/2016   Diastasis recti 08/14/2018   Esophageal dysphagia 05/24/2016   Esophageal stricture 07/18/2013   Fatigue 07/18/2013   Gastroesophageal reflux disease without esophagitis 05/04/2015   Last Assessment &  Plan:    Chronic, uncontrolled; could be due to diet versus worsening gastroparesis   - Continue PPI twice daily   - Continue Pepcid at night   - Start Carafate  up to 4 times daily as needed   - Advised patient she cannot take this with other meds as it will inhibit absorption of them   - Advised patient to reach out to GI and I will also send a message because she needs an   Gastroparesis 08/14/2018   Generalized anxiety disorder with panic attacks 01/25/2017   Hammertoe of right foot 06/21/2020   Hiatal hernia 07/18/2013   Hyponatremia 12/11/2021   IBS (irritable bowel syndrome) 07/18/2013   IDA (iron deficiency  anemia) 07/18/2013   Lower GI bleed 06/17/2021   Lumbar spondylosis 04/30/2017   Major depressive disorder 07/14/2021   Mild dementia 10/15/2022   Mitral valve disorder 07/18/2013   Mixed hyperlipidemia 07/27/2011   Last Assessment & Plan:    Chronic, stable, LDL goal < 70   - cont statin   Last Assessment & Plan:  Chronic, stable, LDL goal < 70 - cont statin     Neck pain 01/14/2020   Nontraumatic complete tear of left rotator cuff 09/02/2017   OAB (overactive bladder) 07/18/2013   Obesity 07/18/2013   Pain of cervical facet joint 01/14/2020   Primary localized osteoarthrosis, forearm 07/18/2013   Primary localized osteoarthrosis, lower leg 07/18/2013   Ptosis of eyelid, bilateral 12/30/2018   Reflux gastritis 07/18/2013   Restless leg syndrome 07/18/2013   Restrictive lung disease 11/25/2015   S/P cervical spinal fusion 01/14/2020   Sepsis (HCC) 06/17/2021   Sigmoid diverticulitis 06/16/2021   Submucosal lesion of stomach 07/18/2013   Trochanteric bursitis of left hip 04/30/2017    Past Surgical History:  Procedure Laterality Date   ABDOMINAL HYSTERECTOMY     HERNIA REPAIR     TONSILLECTOMY      Family History  Problem Relation Age of Onset   Dementia Mother        unspecified type; w/advanced age    Prior to Admission medications   Medication Sig Start Date End Date Taking? Authorizing Provider  albuterol (VENTOLIN HFA) 108 (90 Base) MCG/ACT inhaler Inhale 1-2 puffs into the lungs as needed for wheezing or shortness of breath. 07/13/22  Yes [provider]  lisinopril -hydrochlorothiazide  (ZESTORETIC ) 20-12.5 MG tablet Take 1 tablet by mouth daily. 04/02/23  Yes [provider]  omeprazole (PRILOSEC) 40 MG capsule Take 40 mg by mouth 2 (two) times daily. 05/06/23  Yes [provider]  oxybutynin (DITROPAN-XL) 10 MG 24 hr tablet Take 10 mg by mouth daily. 04/29/23  Yes [provider]  rivastigmine  (EXELON ) 3 MG capsule Take 3 mg by  mouth 2 (two) times daily. 04/01/23  Yes [provider]  rosuvastatin  (CRESTOR ) 10 MG tablet Take 10 mg by mouth. 11/24/21  Yes [provider]  solifenacin (VESICARE) 5 MG tablet Take 5 mg by mouth daily. 02/07/23  Yes [provider]  XULTOPHY 100-3.6 UNIT-MG/ML SOPN Inject 24 Units into the skin daily as needed (insulin  control.). 02/25/23  Yes [provider]    Current Facility-Administered Medications  Medication Dose Route Frequency Provider Last Rate Last Admin   0.9 %  sodium chloride  infusion   Intravenous Continuous Bary Boss, DO 50 mL/hr at 06/06/23 0434 New Bag at 06/06/23 0434   acetaminophen  (TYLENOL ) tablet 650 mg  650 mg Oral Q6H PRN Bary Boss, DO  insulin aspart (novoLOG) injection 0-9 Units  0-9 Units Subcutaneous Q4H Bary Boss, DO   2 Units at 06/06/23 8657   melatonin tablet 5 mg  5 mg Oral QHS PRN Reesa Cannon N, DO       pantoprazole (PROTONIX) injection 40 mg  40 mg Intravenous BID Hall, Carole N, DO       polyethylene glycol (MIRALAX / GLYCOLAX) packet 17 g  17 g Oral Daily PRN Reesa Cannon N, DO       prochlorperazine (COMPAZINE) injection 5 mg  5 mg Intravenous Q6H PRN Hall, Carole N, DO   5 mg at 06/06/23 8469   Current Outpatient Medications  Medication Sig Dispense Refill   albuterol (VENTOLIN HFA) 108 (90 Base) MCG/ACT inhaler Inhale 1-2 puffs into the lungs as needed for wheezing or shortness of breath.     lisinopril-hydrochlorothiazide (ZESTORETIC) 20-12.5 MG tablet Take 1 tablet by mouth daily.     omeprazole (PRILOSEC) 40 MG capsule Take 40 mg by mouth 2 (two) times daily.     oxybutynin (DITROPAN-XL) 10 MG 24 hr tablet Take 10 mg by mouth daily.     rivastigmine (EXELON) 3 MG capsule Take 3 mg by mouth 2 (two) times daily.     rosuvastatin (CRESTOR) 10 MG tablet Take 10 mg by mouth.     solifenacin (VESICARE) 5 MG tablet Take 5 mg by mouth daily.     XULTOPHY 100-3.6 UNIT-MG/ML SOPN Inject 24 Units  into the skin daily as needed (insulin control.).      Allergies as of 06/05/2023 - Review Complete 06/05/2023  Allergen Reaction Noted   Tramadol Other (See Comments), Itching, and Nausea Only 07/18/2013   Codeine Nausea Only 01/05/2020   Donepezil Other (See Comments) 05/30/2022    Social History   Socioeconomic History   Marital status: Single    Spouse name: Not on file   Number of children: 3   Years of education: 12   Highest education level: High school graduate  Occupational History   Occupation: Retired  Tobacco Use   Smoking status: Former   Smokeless tobacco: Never   Tobacco comments:    quit 24 years ago  Substance and Sexual Activity   Alcohol use: Not Currently    Comment: rare   Drug use: Never   Sexual activity: Not on file  Other Topics Concern   Not on file  Social History Narrative   Right handed   Drinks caffeine prn   Lives alone   One story home   retired   Chief Executive Officer Drivers of Corporate investment banker Strain: Low Risk  (06/16/2021)   Received from [employer], [facility]   Overall Financial Resource Strain (CARDIA)    Difficulty of Paying Living Expenses: Not hard at all  Food Insecurity: Not on file  Transportation Needs: Not on file  Physical Activity: Not on file  Stress: No Stress Concern Present (06/16/2021)   Received from [facility], [facility]   [employer] of [facility]ational [facility] - Occupational Stress Questionnaire    Feeling of Stress : Not at all  Social Connections: Unknown (06/16/2021)   Received from [facility], [facility]   Social Network    Social Network: Not on file  Intimate Partner Violence: Unknown (06/16/2021)   Received from [facility], [facility]   HITS    Physically Hurt: Not on file    Insult or Talk Down To: Not on file    Threaten Physical Harm:  Not on file    Scream or Curse: Not on file     Code Status   Code Status: Full Code  Review of Systems: All systems reviewed and  negative except where noted in HPI.  Physical Exam: Vital signs in last 24 hours: Temp:  [98.2 F (36.8 C)-98.8 F (37.1 C)] 98.6 F (37 C) (05/29 0600) Pulse Rate:  [81-105] 82 (05/29 0600) Resp:  [17-23] 17 (05/29 0600) BP: (121-184)/(50-85) 128/50 (05/29 0600) SpO2:  [94 %-100 %] 94 % (05/29 0600) Weight:  [76.7 kg] 76.7 kg (05/28 2159)    General:  Pleasant female in NAD Psych:  Cooperative.  Eyes: Pupils equal Ears:  Normal auditory acuity Nose: No deformity, discharge or lesions Neck:  Supple, no masses felt Lungs:  Clear to auscultation.  Heart:  Regular rate, regular rhythm.  Abdomen:  Soft, nondistended, nontender, active bowel sounds, no masses felt.  Rectal :  Deferred Msk: Symmetrical without gross deformities.  Neurologic:  Alert, oriented to person and place but no time ( year) Extremities : No edema Skin:  Intact without significant lesions.    Intake/Output from previous day: No intake/output data recorded. Intake/Output this shift:  No intake/output data recorded.   Mai Schwalbe, NP-C   06/06/2023, 8:53 AM  I have taken an interval history, thoroughly reviewed the chart and examined the patient. I agree with the Advanced Practitioner's note and impressions, and have recorded additional findings, impressions and recommendations below. I performed a substantive portion of this encounter (>50% time spent), including a complete performance of the medical decision making.  My additional thoughts are as follows:  Acute onset nausea and vomiting, suspect likely gastroenteritis.  Less likely flare of her gastroparesis since that has reportedly been under good control without medicines.  No SBO or GOO on CTAP, abdominal exam benign.  Known stable postsurgical fundoplication anatomic change seen on imaging, probably not contributing to current clinical picture.  Had vomiting of dark material, though was not clearly blood.  Lives alone, has dementia, not entirely  sure which she may have eaten yesterday.  Her daughter is at the bedside for my entire visit and clarifies that history.  Hemoglobin normal, though probably somewhat hemoconcentrated from volume depletion that has since been treated.  After initial conversation with our APP having seen the patient, I thought perhaps an upper endoscopy will be warranted today.  Having seen her since then, have changed my mind and do not feel an upper endoscopy is warranted at this juncture. Recommend further supportive care with antiemetics, close observation, IV fluids, check labs again tomorrow to maintain control of electrolytes and glucose.  Hopefully she improves soon if we are correct that this is an acute infectious process, and no endoscopic intervention may be needed.  Discussed this at length with her and her daughter and they are comfortable with that plan.   Kerby Pearson III Office:[phone]

## 2023-06-06 NOTE — ED Notes (Signed)
 Pt reports relief of nausea and no further vomiting since receiving iv Reglan .

## 2023-06-06 NOTE — ED Notes (Signed)
 Unit notified pt is being transported at this time.

## 2023-06-06 NOTE — H&P (Signed)
 History and Physical  Maria [facility] QMV:[id number] DOB: 1940-11-19 DOA: 06/05/2023  Referring physician: Dwaine Gip  PCP: [facility], [place]  Outpatient Specialists: Pulmonary, GI Patient coming from: Home.  Chief Complaint: Intractable nausea vomiting, possible blood in the emesis.  HPI: Maria [facility] is a 83 y.o. female with medical history significant for cognitive impairment, reported mild dementia, type 2 diabetes with diabetic polyneuropathy, hyperlipidemia, chronic gastric ulcer, presents to the ER from home via EMS due to nausea, vomiting, and possible blood in emesis.  Symptoms started this morning.  Denies have any abdominal pain.  Admits to occasional NSAIDs use for headache.  No reported subjective fevers or chills.  In the ER, tachycardic and tachypneic.  Hemoglobin stable at 14.6.  Patient was started on IV PPI twice daily.  She also received IV antiemetics with improvement of her nausea.  EDP consulted [facility] for possible EGD in the morning.  [facility], hospitalist service, was asked to admit.  At the time of his visit, the patient is alert, she denies having any abdominal pain.  No abdominal tenderness on exam.  Her nausea is improved.  ED Course: Temperature 98.8.  BP 147/69, pulse 107, respiratory rate 23, O2 saturation 99% on room air.  Lab studies notable for WBC 14.0, hemoglobin 14.6, platelet count 339.  Serum glucose 164.  Creatinine 0.84 with GFR grain 60.  Review of Systems: Review of systems as noted in the HPI. All other systems reviewed and are negative.   Past Medical History:  Diagnosis Date   Abdominal distention 08/14/2018   Acute left ankle pain 05/09/2022   Acute non-recurrent pansinusitis 03/07/2021   Last Assessment & Plan:    Acute, uncontrolled, given timeline will treat with antibiotics   - start augmentin   - message back in 1 week, if only mild improvement will consider addition of singulair 10mg  nightly, SE already discussed    - referral to allergy as per patient request     Age-related osteoporosis without current pathological fracture 07/18/2013   Agoraphobia 09/17/2017   Chronic, intermittent  - patient on low dose klonopin as needed  - does not take daily  - discussed risks and benefits of med  - will give daily prn and see how long this lasts  - no refill needed today  - patient compliant  - Reviewed [place] controlled substances registry     Allergic rhinitis 05/04/2015   Asymptomatic premature ventricular contractions 07/27/2011   Benign essential hypertension 07/27/2011   Last Assessment & Plan:    Chronic, well controlled, goal < 130/80   - cont current management   - cont walking     Callus of foot 06/21/2020   Cardiac conduction disorder 07/18/2013   Chronic gastric ulcer 07/18/2013   Chronic obstructive pulmonary disease (COPD) 07/18/2013   Closed fracture of lateral malleolus of left ankle 04/18/2022   Cobalamin deficiency 07/18/2013   Colitis 12/11/2021   Combined forms of age-related cataract of both eyes 09/20/2016   Diastasis recti 08/14/2018   Esophageal dysphagia 05/24/2016   Esophageal stricture 07/18/2013   Fatigue 07/18/2013   Gastroesophageal reflux disease without esophagitis 05/04/2015   Last Assessment & Plan:    Chronic, uncontrolled; could be due to diet versus worsening gastroparesis   - Continue PPI twice daily   - Continue Pepcid at night   - Start Carafate up to 4 times daily as needed   - Advised patient she cannot take this with other meds as it  will inhibit absorption of them   - Advised patient to reach out to GI and I will also send a message because she needs an   Gastroparesis 08/14/2018   Generalized anxiety disorder with panic attacks 01/25/2017   Hammertoe of right foot 06/21/2020   Hiatal hernia 07/18/2013   Hyponatremia 12/11/2021   IBS (irritable bowel syndrome) 07/18/2013   IDA (iron deficiency anemia) 07/18/2013   Lower GI bleed 06/17/2021   Lumbar  spondylosis 04/30/2017   Major depressive disorder 07/14/2021   Mild dementia 10/15/2022   Mitral valve disorder 07/18/2013   Mixed hyperlipidemia 07/27/2011   Last Assessment & Plan:    Chronic, stable, LDL goal < 70   - cont statin   Last Assessment & Plan:  Chronic, stable, LDL goal < 70 - cont statin     Neck pain 01/14/2020   Nontraumatic complete tear of left rotator cuff 09/02/2017   OAB (overactive bladder) 07/18/2013   Obesity 07/18/2013   Pain of cervical facet joint 01/14/2020   Primary localized osteoarthrosis, forearm 07/18/2013   Primary localized osteoarthrosis, lower leg 07/18/2013   Ptosis of eyelid, bilateral 12/30/2018   Reflux gastritis 07/18/2013   Restless leg syndrome 07/18/2013   Restrictive lung disease 11/25/2015   S/P cervical spinal fusion 01/14/2020   Sepsis (HCC) 06/17/2021   Sigmoid diverticulitis 06/16/2021   Submucosal lesion of stomach 07/18/2013   Trochanteric bursitis of left hip 04/30/2017   Past Surgical History:  Procedure Laterality Date   ABDOMINAL HYSTERECTOMY     HERNIA REPAIR     TONSILLECTOMY      Social History:  reports that she has quit smoking. She has never used smokeless tobacco. She reports that she does not currently use alcohol. She reports that she does not use drugs.   Allergies  Allergen Reactions   Tramadol Other (See Comments), Itching and Nausea Only   Codeine Nausea Only   Donepezil Other (See Comments)    Family History  Problem Relation Age of Onset   Dementia Mother        unspecified type; w/advanced age      Prior to Admission medications   Medication Sig Start Date End Date Taking? Authorizing Provider  albuterol (VENTOLIN HFA) 108 (90 Base) MCG/ACT inhaler Inhale 1-2 puffs into the lungs as needed for wheezing or shortness of breath. 07/13/22  Yes [provider]  lisinopril-hydrochlorothiazide (ZESTORETIC) 20-12.5 MG tablet Take 1 tablet by mouth daily. 04/02/23  Yes [provider]  omeprazole (PRILOSEC) 40 MG capsule Take 40 mg by mouth 2 (two) times daily. 05/06/23  Yes [provider]  oxybutynin (DITROPAN-XL) 10 MG 24 hr tablet Take 10 mg by mouth daily. 04/29/23  Yes [provider]  rivastigmine (EXELON) 3 MG capsule Take 3 mg by mouth 2 (two) times daily. 04/01/23  Yes [provider]  rosuvastatin (CRESTOR) 10 MG tablet Take 10 mg by mouth. 11/24/21  Yes [provider]  solifenacin (VESICARE) 5 MG tablet Take 5 mg by mouth daily. 02/07/23  Yes [provider]  XULTOPHY 100-3.6 UNIT-MG/ML SOPN Inject 24 Units into the skin daily as needed (insulin control.). 02/25/23  Yes [provider]    Physical Exam: BP (!) 147/69   Pulse 85   Temp 98.8 F (37.1 C) (Oral)   Resp (!) 23   Ht 5' 4.5" (1.638 m)   Wt 76.7 kg   SpO2 98%   BMI 28.58 kg/m   General: 83 y.o. year-old female well  developed well nourished in no acute distress.  Alert and oriented x 2. Cardiovascular: Regular rate and rhythm with no rubs or gallops.  No thyromegaly or JVD noted.  No lower extremity edema. 2/4 pulses in all 4 extremities. Respiratory: Clear to auscultation with no wheezes or rales. Good inspiratory effort. Abdomen: Soft nontender nondistended with normal bowel sounds x4 quadrants. Muskuloskeletal: No cyanosis, clubbing or edema noted bilaterally Neuro: CN II-XII intact, strength, sensation, reflexes Skin: No ulcerative lesions noted or rashes Psychiatry: Judgement and insight appear altered. Mood is appropriate for condition and setting          Labs on Admission:  Basic Metabolic Panel: Recent Labs  Lab 06/05/23 2253  NA 131*  K 3.7  CL 88*  CO2 28  GLUCOSE 250*  BUN 22  CREATININE 1.00  CALCIUM 9.6   Liver Function Tests: Recent Labs  Lab 06/05/23 2253  AST 19  ALT 14  ALKPHOS 70  BILITOT 0.7  PROT 7.1  ALBUMIN 3.7   Recent Labs  Lab 06/05/23 2253  LIPASE 26   No results for input(s): "AMMONIA"  in the last 168 hours. CBC: Recent Labs  Lab 06/05/23 2253 06/06/23 0420  WBC 14.4* 14.0*  NEUTROABS 13.0*  --   HGB 15.5* 14.6  HCT 47.6* 44.7  MCV 82.8 82.2  PLT 381 339   Cardiac Enzymes: No results for input(s): "CKTOTAL", "CKMB", "CKMBINDEX", "TROPONINI" in the last 168 hours.  BNP (last 3 results) No results for input(s): "BNP" in the last 8760 hours.  ProBNP (last 3 results) No results for input(s): "PROBNP" in the last 8760 hours.  CBG: Recent Labs  Lab 06/06/23 0319  GLUCAP 170*    Radiological Exams on Admission: CT ABDOMEN PELVIS W CONTRAST Result Date: 06/06/2023 CLINICAL DATA:  Abdominal pain, acute, nonlocalized.  Vomiting EXAM: CT ABDOMEN AND PELVIS WITH CONTRAST TECHNIQUE: Multidetector CT imaging of the abdomen and pelvis was performed using the standard protocol following bolus administration of intravenous contrast. RADIATION DOSE REDUCTION: This exam was performed according to the departmental dose-optimization program which includes automated exposure control, adjustment of the mA and/or kV according to patient size and/or use of iterative reconstruction technique. CONTRAST:  75mL OMNIPAQUE IOHEXOL 350 MG/ML SOLN COMPARISON:  CT abdomen pelvis 08/18/2018 trauma PET CT 02/13/2023 FINDINGS: Lower chest: Persistent at small volume hiatal hernia. Gastroesophageal wall thickening. Hepatobiliary: No focal liver abnormality. No gallstones, gallbladder wall thickening, or pericholecystic fluid. No biliary dilatation. Pancreas: No focal lesion. Normal pancreatic contour. No surrounding inflammatory changes. No main pancreatic ductal dilatation. Spleen: Normal in size without focal abnormality. Adrenals/Urinary Tract: No adrenal nodule bilaterally. Bilateral kidneys enhance symmetrically. Parapelvic right simple cyst. Simple renal cysts, in the absence of clinically indicated signs/symptoms, require no independent follow-up. No hydronephrosis. No hydroureter.  No  nephroureterolithiasis. The urinary bladder is unremarkable. On delayed imaging, there is no urothelial wall thickening and there are no filling defects in the opacified portions of the bilateral collecting systems or ureters. Stomach/Bowel: Stomach is within normal limits. No evidence of bowel wall thickening or dilatation. Second portion of the duodenum diverticula. Colonic diverticulosis. Appendix appears normal. Vascular/Lymphatic: No abdominal aorta or iliac aneurysm. Severe atherosclerotic plaque of the aorta and its branches. No abdominal, pelvic, or inguinal lymphadenopathy. Reproductive: Status post hysterectomy. No adnexal masses. Other: No intraperitoneal free fluid. No intraperitoneal free gas. No organized fluid collection. Musculoskeletal: No abdominal wall hernia or abnormality. No suspicious lytic or blastic osseous lesions. No acute displaced fracture. Grade 1 anterolisthesis of  L4 on L5. IMPRESSION: 1. Persistent at small volume hiatal hernia with gastroesophageal wall thickening. Correlate with signs and symptoms of esophagitis. Underlying mass not excluded. Consider direct visualization. 2. Colonic diverticulosis with no acute diverticulitis. 3.  Aortic Atherosclerosis (ICD10-I70.0). Electronically Signed   By: Morgane  Naveau M.D.   On: 06/06/2023 00:43    EKG: I independently viewed the EKG done and my findings are as followed: Sinus tachycardia rate of 103.  Nonspecific ST-T changes.  QTc 474.  Assessment/Plan Present on Admission:  Upper GI bleed  Principal Problem:   Upper GI bleed  Concern for upper GI bleed History of gastric ulcer CT scan findings concerning for esophagitis Continue IV PPI twice daily Continue IV antiemetics as needed Continue gentle IV fluid hydration [facility] consulted by EDP for possible endoscopy in the morning N.p.o. except for sips with meds until seen by GI. Hemoglobin currently stable at 14 Closely monitor hemoglobin level Repeat  CBC  Leukocytosis, suspect reactive UA negative for pyuria No reported upper respiratory infection symptoms No reported subjective fevers or chills. Continue IV PPI twice daily Repeat CBC in the morning  Type 2 diabetes with hyperglycemia Last hemoglobin A1c 6.9 on 06/06/2023 Insulin sliding scale every 4 hours while n.p.o.  Cognitive impairment History of mild dementia Reorient as needed   Critical care time: 55 minutes.   DVT prophylaxis: SCDs.  Code Status: Full code.  Family Communication: None at bedside.  Disposition Plan: Admitted to progressive care unit.  Consults called: [facility] consulted by EDP.  Admission status: Inpatient status.   Status is: Inpatient The patient requires at least 2 midnights for further evaluation and treatment of present condition.   Bary Boss MD [facility] Pager [phone]  If 7PM-7AM, please contact night-coverage [email or web address] Password [email or web address]  06/06/2023, 5:26 AM

## 2023-06-06 NOTE — Progress Notes (Addendum)
 Patient seen and examined, admitted this morning by Dr. Del Favia, please see her H&P for details briefly 83/F with history of mild dementia, type 2 diabetes mellitus, diabetic neuropathy, history of GERD, prior gastric ulcer presented to the ED from home with acute onset nausea vomiting started yesterday with 1 episode of hematemesis.  Denies NSAID use to me, admitted to occasional NSAID use for headache to admitting MD. - In the ED, hypertensive, hemoglobin was 14.6.  Assessment and plan  Nausea and vomiting 1 episode of hematemesis Suspected esophagitis on CT - Supportive care, continue IV fluids, PPI, antiemetics - Hemoglobin is stable - [facility] was consulted on admission  Type 2 diabetes mellitus - CBGs are stable, recent A1c was 6.9, continue SSI  Mild dementia, cognitive impairment - At risk of delirium  Mild leukocytosis, likely reactive, monitor  Full code 2 daughters at bedside  Maria Fast, MD

## 2023-06-06 NOTE — Progress Notes (Signed)
 Pt c/o nausea, no vomiting. Sips of water/gingerale made her feel like vomiting. Zofran given, pt resting since administration. Daughter at bedside. Ambulated to bathroom with mild generalized weakness. VSS throughout shift.

## 2023-06-06 NOTE — ED Provider Notes (Signed)
 [facility] AT [facility] Provider Note   CSN: [id number] Arrival date & time: 06/05/23  2149     History  Chief Complaint  Patient presents with   Emesis    Maria Clay is a 83 y.o. female.  Patient presents to the Emergency Department via EMS complaining of nausea and vomiting which began sometime earlier today.  Patient with history of type II DM, chronic gastric ulcer, COPD, colitis, gastroparesis mild dementia.  Patient here with family.  Family states the patient is poor historian but that the patient did state that sometime during the day today she began having multiple episodes of emesis.  She also endorses generalized abdominal discomfort.  She denies fever, diarrhea, chest pain, shortness of breath, urinary symptoms.  She states her last bowel movement was yesterday.  Family reports some dark-colored emesis concern for possible blood.   Emesis      Home Medications Prior to Admission medications   Medication Sig Start Date End Date Taking? Authorizing Provider  albuterol (VENTOLIN HFA) 108 (90 Base) MCG/ACT inhaler Inhale into the lungs. 07/13/22   [provider]  amitriptyline (ELAVIL) 25 MG tablet 25 mg. 08/13/16   [provider]  aspirin EC 81 MG tablet One half of a baby aspirin daily as able to be tolerated. 07/22/14   [provider]  Blood Glucose Monitoring Suppl (TGT BLOOD GLUCOSE MONITORING) w/Device KIT Dispense 1 onetouch verio flex glucometer 07/13/16   [provider]  gabapentin (NEURONTIN) 300 MG capsule Take 300 mg by mouth at bedtime.    [provider]  lidocaine  (LIDODERM ) 5 % Place 1 patch onto the skin daily as needed. Apply patch to area most significant pain once per day.  Remove and discard patch within 12 hours of application. 01/05/20   Petrucelli, Samantha R, PA-C      Allergies    Tramadol, Codeine, and Donepezil    Review of Systems   Review of Systems   Gastrointestinal:  Positive for vomiting.    Physical Exam Updated Vital Signs BP (!) 160/67   Pulse 96   Temp 98.2 F (36.8 C) (Oral)   Resp (!) 23   Ht 5' 4.5" (1.638 m)   Wt 76.7 kg   SpO2 100%   BMI 28.58 kg/m  Physical Exam Vitals and nursing note reviewed.  Constitutional:      General: She is not in acute distress.    Appearance: She is well-developed.  HENT:     Head: Normocephalic and atraumatic.  Eyes:     Conjunctiva/sclera: Conjunctivae normal.  Cardiovascular:     Rate and Rhythm: Normal rate and regular rhythm.     Heart sounds: No murmur heard. Pulmonary:     Effort: Pulmonary effort is normal. No respiratory distress.     Breath sounds: Normal breath sounds.  Abdominal:     Palpations: Abdomen is soft.     Tenderness: There is abdominal tenderness.     Comments: Generalized abdominal tenderness  Musculoskeletal:        General: No swelling.     Cervical back: Neck supple.  Skin:    General: Skin is warm and dry.     Capillary Refill: Capillary refill takes less than 2 seconds.  Neurological:     Mental Status: She is alert.  Psychiatric:        Mood and Affect: Mood normal.     ED Results / Procedures / Treatments   Labs (all  labs ordered are listed, but only abnormal results are displayed) Labs Reviewed  CBC WITH DIFFERENTIAL/PLATELET - Abnormal; Notable for the following components:      Result Value   WBC 14.4 (*)    RBC 5.75 (*)    Hemoglobin 15.5 (*)    HCT 47.6 (*)    Neutro Abs 13.0 (*)    All other components within normal limits  COMPREHENSIVE METABOLIC PANEL WITH GFR - Abnormal; Notable for the following components:   Sodium 131 (*)    Chloride 88 (*)    Glucose, Bld 250 (*)    GFR, Estimated 57 (*)    All other components within normal limits  LIPASE, BLOOD  URINALYSIS, ROUTINE W REFLEX MICROSCOPIC    EKG None  Radiology CT ABDOMEN PELVIS W CONTRAST Result Date: 06/06/2023 CLINICAL DATA:  Abdominal pain, acute,  nonlocalized.  Vomiting EXAM: CT ABDOMEN AND PELVIS WITH CONTRAST TECHNIQUE: Multidetector CT imaging of the abdomen and pelvis was performed using the standard protocol following bolus administration of intravenous contrast. RADIATION DOSE REDUCTION: This exam was performed according to the departmental dose-optimization program which includes automated exposure control, adjustment of the mA and/or kV according to patient size and/or use of iterative reconstruction technique. CONTRAST:  75mL OMNIPAQUE IOHEXOL 350 MG/ML SOLN COMPARISON:  CT abdomen pelvis 08/18/2018 trauma PET CT 02/13/2023 FINDINGS: Lower chest: Persistent at small volume hiatal hernia. Gastroesophageal wall thickening. Hepatobiliary: No focal liver abnormality. No gallstones, gallbladder wall thickening, or pericholecystic fluid. No biliary dilatation. Pancreas: No focal lesion. Normal pancreatic contour. No surrounding inflammatory changes. No main pancreatic ductal dilatation. Spleen: Normal in size without focal abnormality. Adrenals/Urinary Tract: No adrenal nodule bilaterally. Bilateral kidneys enhance symmetrically. Parapelvic right simple cyst. Simple renal cysts, in the absence of clinically indicated signs/symptoms, require no independent follow-up. No hydronephrosis. No hydroureter.  No nephroureterolithiasis. The urinary bladder is unremarkable. On delayed imaging, there is no urothelial wall thickening and there are no filling defects in the opacified portions of the bilateral collecting systems or ureters. Stomach/Bowel: Stomach is within normal limits. No evidence of bowel wall thickening or dilatation. Second portion of the duodenum diverticula. Colonic diverticulosis. Appendix appears normal. Vascular/Lymphatic: No abdominal aorta or iliac aneurysm. Severe atherosclerotic plaque of the aorta and its branches. No abdominal, pelvic, or inguinal lymphadenopathy. Reproductive: Status post hysterectomy. No adnexal masses. Other: No  intraperitoneal free fluid. No intraperitoneal free gas. No organized fluid collection. Musculoskeletal: No abdominal wall hernia or abnormality. No suspicious lytic or blastic osseous lesions. No acute displaced fracture. Grade 1 anterolisthesis of L4 on L5. IMPRESSION: 1. Persistent at small volume hiatal hernia with gastroesophageal wall thickening. Correlate with signs and symptoms of esophagitis. Underlying mass not excluded. Consider direct visualization. 2. Colonic diverticulosis with no acute diverticulitis. 3.  Aortic Atherosclerosis (ICD10-I70.0). Electronically Signed   By: Morgane  Naveau M.D.   On: 06/06/2023 00:43    Procedures Procedures    Medications Ordered in ED Medications  pantoprazole (PROTONIX) injection 40 mg (has no administration in time range)  sodium chloride 0.9 % bolus 1,000 mL (0 mLs Intravenous Stopped 06/06/23 0114)  ondansetron (ZOFRAN) injection 4 mg (4 mg Intravenous Given 06/05/23 2246)  metoCLOPramide (REGLAN) injection 10 mg (10 mg Intravenous Given 06/06/23 0002)  iohexol (OMNIPAQUE) 350 MG/ML injection 75 mL (75 mLs Intravenous Contrast Given 06/06/23 0032)  pantoprazole (PROTONIX) injection 40 mg (40 mg Intravenous Given 06/06/23 0208)    ED Course/ Medical Decision Making/ A&P  Medical Decision Making Amount and/or Complexity of Data Reviewed Labs: ordered. Radiology: ordered.  Risk Prescription drug management. Decision regarding hospitalization.   This patient presents to the ED for concern of nausea and vomiting, this involves an extensive number of treatment options, and is a complaint that carries with it a high risk of complications and morbidity.  The differential diagnosis includes gastritis, cholecystitis, appendicitis, gastroparesis, others   Co morbidities / Chronic conditions that complicate the patient evaluation  History of esophagitis, GERD, IBS   Additional history obtained:  Additional  history obtained from EMR External records from outside source obtained and reviewed including oncology notes   Lab Tests:  I Ordered, and personally interpreted labs.  The pertinent results include: Hemoglobin 15.5, lipase 26, leukocytosis with a white count of 14,400   Imaging Studies ordered:  I ordered imaging studies including CT abdomen pelvis with contrast I independently visualized and interpreted imaging which showed  1. Persistent at small volume hiatal hernia with gastroesophageal  wall thickening. Correlate with signs and symptoms of esophagitis.  Underlying mass not excluded. Consider direct visualization.  2. Colonic diverticulosis with no acute diverticulitis.  3.  Aortic Atherosclerosis   I agree with the radiologist interpretation   Cardiac Monitoring: / EKG:  The patient was maintained on a cardiac monitor.  I personally viewed and interpreted the cardiac monitored which showed an underlying rhythm of: Sinus tachycardia   Problem List / ED Course / Critical interventions / Medication management   I ordered medication including Zofran  and Reglan  for nausea, Protonix , saline bolus Reevaluation of the patient after these medicines showed that the patient improved I have reviewed the patients home medicines and have made adjustments as needed   Consultations Obtained:  I requested consultation with the hospitalist, Dr.Hall,  and discussed lab and imaging findings as well as pertinent plan - they recommend: admission  I sent a secure chat message to Dr. Yvone Herd with [facility] to have patient added to rounding list.   Social Determinants of Health:  Patient is a former smoker   Test / Admission - Considered:  Patient with nausea and vomiting.  CT scan showing signs of possible esophagitis.  Patient does have a few episodes of hematemesis.  Feel the patient benefit from admission for further management along with a GI consult for likely  endoscopy.  I have secure chatted GI to have patient added to the rounding list.  Patient started on Protonix  due to possible upper GI bleed.  Nausea has improved with antiemetics.         Final Clinical Impression(s) / ED Diagnoses Final diagnoses:  Upper GI bleed  Generalized abdominal pain  Nausea and vomiting, unspecified vomiting type    Rx / DC Orders ED Discharge Orders     None         Delories Fetter 06/06/23 0224    Lindle Rhea, MD 06/07/23 [id number]

## 2023-06-07 DIAGNOSIS — K3184 Gastroparesis: Secondary | ICD-10-CM

## 2023-06-07 DIAGNOSIS — R933 Abnormal findings on diagnostic imaging of other parts of digestive tract: Secondary | ICD-10-CM | POA: Diagnosis not present

## 2023-06-07 DIAGNOSIS — K922 Gastrointestinal hemorrhage, unspecified: Secondary | ICD-10-CM | POA: Diagnosis not present

## 2023-06-07 DIAGNOSIS — K449 Diaphragmatic hernia without obstruction or gangrene: Secondary | ICD-10-CM | POA: Diagnosis not present

## 2023-06-07 DIAGNOSIS — R1110 Vomiting, unspecified: Secondary | ICD-10-CM

## 2023-06-07 LAB — CBC
HCT: 38.9 % (ref 36.0–46.0)
Hemoglobin: 12.4 g/dL (ref 12.0–15.0)
MCH: 27.1 pg (ref 26.0–34.0)
MCHC: 31.9 g/dL (ref 30.0–36.0)
MCV: 84.9 fL (ref 80.0–100.0)
Platelets: 243 10*3/uL (ref 150–400)
RBC: 4.58 MIL/uL (ref 3.87–5.11)
RDW: 15 % (ref 11.5–15.5)
WBC: 7 10*3/uL (ref 4.0–10.5)
nRBC: 0 % (ref 0.0–0.2)

## 2023-06-07 LAB — BASIC METABOLIC PANEL WITH GFR
Anion gap: 10 (ref 5–15)
BUN: 15 mg/dL (ref 8–23)
CO2: 26 mmol/L (ref 22–32)
Calcium: 8.7 mg/dL — ABNORMAL LOW (ref 8.9–10.3)
Chloride: 100 mmol/L (ref 98–111)
Creatinine, Ser: 0.78 mg/dL (ref 0.44–1.00)
GFR, Estimated: >60 mL/min (ref >60)
Glucose, Bld: 129 mg/dL — ABNORMAL HIGH (ref 70–99)
Potassium: 3.3 mmol/L — ABNORMAL LOW (ref 3.5–5.1)
Sodium: 136 mmol/L (ref 135–145)

## 2023-06-07 LAB — GLUCOSE, CAPILLARY
Glucose-Capillary: 117 mg/dL — ABNORMAL HIGH (ref 70–99)
Glucose-Capillary: 115 mg/dL — ABNORMAL HIGH (ref 70–99)
Glucose-Capillary: 127 mg/dL — ABNORMAL HIGH (ref 70–99)
Glucose-Capillary: 202 mg/dL — ABNORMAL HIGH (ref 70–99)
Glucose-Capillary: 171 mg/dL — ABNORMAL HIGH (ref 70–99)
Glucose-Capillary: 140 mg/dL — ABNORMAL HIGH (ref 70–99)

## 2023-06-07 MED ORDER — SUCRALFATE 1 GM/10ML PO SUSP
1.0000 g | Freq: Three times a day (TID) | ORAL | Status: DC
  Administered 2023-06-07 – 2023-06-08 (×3): 1 g via ORAL
  Filled 2023-06-07 (×3): qty 10

## 2023-06-07 MED ORDER — POTASSIUM CHLORIDE CRYS ER 20 MEQ PO TBCR
40.0000 meq | EXTENDED_RELEASE_TABLET | Freq: Once | ORAL | Status: AC
  Administered 2023-06-07: 40 meq via ORAL
  Filled 2023-06-07: qty 2

## 2023-06-07 MED ORDER — PANTOPRAZOLE SODIUM 40 MG PO TBEC
40.0000 mg | DELAYED_RELEASE_TABLET | Freq: Two times a day (BID) | ORAL | Status: DC
  Administered 2023-06-07 – 2023-06-08 (×2): 40 mg via ORAL
  Filled 2023-06-07 (×2): qty 1

## 2023-06-07 MED ORDER — LISINOPRIL-HYDROCHLOROTHIAZIDE 20-12.5 MG PO TABS: 1.0000 | ORAL_TABLET | Freq: Every day | ORAL | Status: DC

## 2023-06-07 MED ORDER — RIVASTIGMINE TARTRATE 1.5 MG PO CAPS
3.0000 mg | ORAL_CAPSULE | Freq: Two times a day (BID) | ORAL | Status: DC
  Administered 2023-06-07 – 2023-06-08 (×2): 3 mg via ORAL
  Filled 2023-06-07 (×3): qty 2

## 2023-06-07 MED ORDER — HYDROCHLOROTHIAZIDE 12.5 MG PO TABS
12.5000 mg | ORAL_TABLET | Freq: Every day | ORAL | Status: DC
  Administered 2023-06-07 – 2023-06-08 (×2): 12.5 mg via ORAL
  Filled 2023-06-07 (×2): qty 1

## 2023-06-07 MED ORDER — LISINOPRIL 20 MG PO TABS
20.0000 mg | ORAL_TABLET | Freq: Every day | ORAL | Status: DC
  Administered 2023-06-07 – 2023-06-08 (×2): 20 mg via ORAL
  Filled 2023-06-07 (×2): qty 1

## 2023-06-07 MED ORDER — ROSUVASTATIN CALCIUM 5 MG PO TABS
10.0000 mg | ORAL_TABLET | Freq: Every day | ORAL | Status: DC
  Administered 2023-06-07 – 2023-06-08 (×2): 10 mg via ORAL
  Filled 2023-06-07 (×2): qty 2

## 2023-06-07 NOTE — Plan of Care (Signed)

## 2023-06-07 NOTE — Progress Notes (Addendum)
 Daily Progress Note  DOA: 06/05/2023 Hospital Day: 3   Cc:  ? hematemesis  Brief History:  83 y.o. year old female with a medical history including but not limited to DM2 with polyneuropathy, PUD, gastroparesis, GERD, history of Nissen fundoplication, periampullary duodenal diverticulum, PUD, colonic diverticulosis, reported dementia, emphysema. Admitted 5/29 with vomiting / dark brown emesis.  See 5/29 GI note  ASSESSMENT    Acute vomiting / dark emesis .  Vomiting could have been 2/2 to gastroenteritis. If dark emesis represented blood then may have been 2/2 to esophagitis.  Resolving Today:  Overnight drift in hgb (14.6 >> 12.4) but appears she was getting gentle IV hydration until around 2:30 am.  Had one episode of non-bloody emesis around midnight per Nurse. Tolerated clear liquids today. BUN remains normal. WBC has normalized.  [name]    GERD / history of Nissen fundoplication.  CT scan shows small hiatal hernia and gastroesophageal wall thickening.   Gastroparesis  Generally able to manage with small meals  Reported dementia  Principal Problem:   Upper GI bleed   PLAN   --Continue BID IV PPI --She wants to go home. Will see how she does with full liquids.   Subjective   Denies N/V today. Consumed almost all of her clear liquid tray. Wants to go home  Objective   GI Studies:    Recent Labs    06/05/23 2253 06/06/23 0420 06/07/23 1035  WBC 14.4* 14.0* 7.0  HGB 15.5* 14.6 12.4  HCT 47.6* 44.7 38.9  MCV 82.8 82.2 84.9  PLT 381 339 243   No results for input(s): "FOLATE", "VITAMINB12", "FERRITIN", "TIBC", "IRONPCTSAT" in the last 72 hours. Recent Labs    06/05/23 2253 06/06/23 0420  NA 131* 136  K 3.7 3.5  CL 88* 95*  CO2 28 30  GLUCOSE 250* 164*  BUN 22 19  CREATININE 1.00 0.84  CALCIUM 9.6 9.0   Recent Labs    06/05/23 2253  PROT 7.1  ALBUMIN 3.7  AST 19  ALT 14  ALKPHOS 70  BILITOT 0.7    Imaging:  CT ABDOMEN PELVIS W  CONTRAST CLINICAL DATA:  Abdominal pain, acute, nonlocalized.  Vomiting  EXAM: CT ABDOMEN AND PELVIS WITH CONTRAST  TECHNIQUE: Multidetector CT imaging of the abdomen and pelvis was performed using the standard protocol following bolus administration of intravenous contrast.  RADIATION DOSE REDUCTION: This exam was performed according to the departmental dose-optimization program which includes automated exposure control, adjustment of the mA and/or kV according to patient size and/or use of iterative reconstruction technique.  CONTRAST:  75mL OMNIPAQUE IOHEXOL 350 MG/ML SOLN  COMPARISON:  CT abdomen pelvis 08/18/2018 trauma PET CT 02/13/2023  FINDINGS: Lower chest: Persistent at small volume hiatal hernia. Gastroesophageal wall thickening.  Hepatobiliary: No focal liver abnormality. No gallstones, gallbladder wall thickening, or pericholecystic fluid. No biliary dilatation.  Pancreas: No focal lesion. Normal pancreatic contour. No surrounding inflammatory changes. No main pancreatic ductal dilatation.  Spleen: Normal in size without focal abnormality.  Adrenals/Urinary Tract:  No adrenal nodule bilaterally.  Bilateral kidneys enhance symmetrically. Parapelvic right simple cyst. Simple renal cysts, in the absence of clinically indicated signs/symptoms, require no independent follow-up.  No hydronephrosis. No hydroureter.  No nephroureterolithiasis.  The urinary bladder is unremarkable.  On delayed imaging, there is no urothelial wall thickening and there are no filling defects in the opacified portions of the bilateral collecting systems or ureters.  Stomach/Bowel: Stomach is within normal limits. No evidence of  bowel wall thickening or dilatation. Second portion of the duodenum diverticula. Colonic diverticulosis. Appendix appears normal.  Vascular/Lymphatic: No abdominal aorta or iliac aneurysm. Severe atherosclerotic plaque of the aorta and its branches. No  abdominal, pelvic, or inguinal lymphadenopathy.  Reproductive: Status post hysterectomy. No adnexal masses.  Other: No intraperitoneal free fluid. No intraperitoneal free gas. No organized fluid collection.  Musculoskeletal:  No abdominal wall hernia or abnormality.  No suspicious lytic or blastic osseous lesions. No acute displaced fracture. Grade 1 anterolisthesis of L4 on L5.  IMPRESSION: 1. Persistent at small volume hiatal hernia with gastroesophageal wall thickening. Correlate with signs and symptoms of esophagitis. Underlying mass not excluded. Consider direct visualization. 2. Colonic diverticulosis with no acute diverticulitis. 3.  Aortic Atherosclerosis (ICD10-I70.0).  Electronically Signed   By: Morgane  Naveau M.D.   On: 06/06/2023 00:43     Scheduled inpatient medications:   insulin aspart  0-9 Units Subcutaneous Q4H   pantoprazole (PROTONIX) IV  40 mg Intravenous BID   Continuous inpatient infusions:  PRN inpatient medications: acetaminophen, melatonin, ondansetron (ZOFRAN) IV, polyethylene glycol, prochlorperazine  Vital signs in last 24 hours: Temp:  [97.8 F (36.6 C)-99.1 F (37.3 C)] 98.7 F (37.1 C) (05/30 0839) Pulse Rate:  [76-99] 99 (05/30 0839) Resp:  [20-24] 24 (05/30 0839) BP: (129-170)/(52-74) 152/74 (05/30 0839) SpO2:  [91 %-96 %] 94 % (05/30 0504)   No intake or output data in the 24 hours ending 06/07/23 1057  Intake/Output from previous day: No intake/output data recorded. Intake/Output this shift: No intake/output data recorded.   Physical Exam:  General: Alert female in NAD Heart:  Regular rate and rhythm.  Pulmonary: Normal respiratory effort Abdomen: Soft, nondistended, nontender. Normal bowel sounds. Extremities: No lower extremity edema  Neurologic: Alert and oriented Psych: Pleasant. Cooperative     LOS: 1 day   Mai Schwalbe ,NP 06/07/2023, 10:57 AM  I have taken an interval history, thoroughly reviewed the  chart and examined the patient. I agree with the Advanced Practitioner's note, impression and recommendations, and have recorded additional findings, impressions and recommendations below. I performed a substantive portion of this encounter (>50% time spent), including a complete performance of the medical decision making.  My additional thoughts are as follows:  Improved today, volume replete.  Tolerating some liquids.  Hemoglobin dropped but still in normal range, most likely due to volume resuscitation from original hemoconcentration. Probably acute infectious illness, though suboptimally controlled diabetes may be contributing as well.  Hemoglobin A1c 6.9, and family says that she cannot always remember to take her insulin and other medicines correctly.  Definitely needs attention from primary care after discharge.  I recommended she let us  keep her for observation until tomorrow and make sure she continues to improve prior to discharge home, especially given her age and medical comorbidities.  She is agreeable to that.  Getting continue current supportive care and symptomatic treatment.  We will return to see patient if called, otherwise expect she will be able to go home tomorrow if continues on current trajectory.  She has outpatient GI care established at an [facility] practice, and should follow-up with them after discharge.   Kerby Pearson III Office:[phone]

## 2023-06-07 NOTE — Plan of Care (Signed)
  Problem: Nutritional: Goal: Maintenance of adequate nutrition will improve Outcome: Progressing   Problem: Skin Integrity: Goal: Risk for impaired skin integrity will decrease Outcome: Progressing   Problem: Education: Goal: Knowledge of General Education information will improve Description: Including pain rating scale, medication(s)/side effects and non-pharmacologic comfort measures Outcome: Progressing   Problem: Health Behavior/Discharge Planning: Goal: Ability to manage health-related needs will improve Outcome: Progressing

## 2023-06-07 NOTE — Progress Notes (Addendum)
 PROGRESS NOTE    Maria Clay  ZOX:[id number] DOB: 1941-01-04 DOA: 06/05/2023 PCP: [facility]     Chief Complaint  Patient presents with   Emesis    Brief Narrative:    Maria Clay is a 83 y.o. female with medical history significant for cognitive impairment, reported mild dementia, type 2 diabetes with diabetic polyneuropathy, hyperlipidemia, chronic gastric ulcer, presents to the ER from home via EMS due to nausea, vomiting, and possible blood in emesis.  - Workup in ED significant for stable hemoglobin, she was seen by GI and admitted for further workup.  .   Assessment & Plan:   Principal Problem:   Upper GI bleed    Nausea, vomiting History of gastric ulcer CT scan findings concerning for esophagitis Questionable hematemesis. - Underlying history of gastroparesis, hiatal hernia repair with fundoplication, she reports intermittent nausea and vomiting, but not to a such degree. - Concern for esophagitis. - Continue with IV Protonix , will add Carafate . - CBC closely, hemoglobin trending down from 14.6-12.4, but likely dilutional -Episode of vomiting overnight, but per daughter does appear to be clear liquid.   Leukocytosis, suspect reactive UA negative for pyuria No reported upper respiratory infection symptoms No reported subjective fevers or chills. Continue IV PPI twice daily Resolved   Type 2 diabetes with hyperglycemia Last hemoglobin A1c 6.9 on 06/06/2023 Continue with insulin  sliding scale   Cognitive impairment History of mild dementia Reorient as needed  Hyperlipidemia -Continue with home statin  Hypokalemia -replaced  Hypertension -Blood pressure is acceptable, continue t with home regimen  DVT prophylaxis: SCD Code Status: Full code Family Communication: Discussed with daughter at bedside Disposition:   Status is: Inpatient    Consultants:  GI   Subjective:  No chest pain, no shortness of breath, had an  episode of vomiting overnight, daughter at bedside, described as clear liquid, no blood in it  Objective: Vitals:   06/07/23 0113 06/07/23 0504 06/07/23 0839 06/07/23 1227  BP: 139/66 (!) 129/52 (!) 152/74 116/61  Pulse: 77 76 99 76  Resp: 20 (!) 21 (!) 24 17  Temp: 99 F (37.2 C) 97.8 F (36.6 C) 98.7 F (37.1 C) 98.6 F (37 C)  TempSrc: Oral Oral Oral Oral  SpO2: 91% 94%    Weight:      Height:       No intake or output data in the 24 hours ending 06/07/23 1339 Filed Weights   06/05/23 2159  Weight: 76.7 kg    Examination:  Awake Alert, Oriented X 3, No new F.N deficits, Normal affect Symmetrical Chest wall movement, Good air movement bilaterally, CTAB RRR,No Gallops,Rubs or new Murmurs, No Parasternal Heave +ve B.Sounds, Abd Soft, No tenderness, No rebound - guarding or rigidity. No Cyanosis, Clubbing or edema, No new Rash or bruise      Data Reviewed: I have personally reviewed following labs and imaging studies  CBC: Recent Labs  Lab 06/05/23 2253 06/06/23 0420 06/07/23 1035  WBC 14.4* 14.0* 7.0  NEUTROABS 13.0*  --   --   HGB 15.5* 14.6 12.4  HCT 47.6* 44.7 38.9  MCV 82.8 82.2 84.9  PLT 381 339 243    Basic Metabolic Panel: Recent Labs  Lab 06/05/23 2253 06/06/23 0420 06/07/23 1035  NA 131* 136 136  K 3.7 3.5 3.3*  CL 88* 95* 100  CO2 28 30 26   GLUCOSE 250* 164* 129*  BUN 22 19 15   CREATININE 1.00 0.84 0.78  CALCIUM  9.6  9.0 8.7*  MG  --  2.0  --   PHOS  --  3.7  --     GFR: Estimated Creatinine Clearance: 55.9 mL/min (by C-G formula based on SCr of 0.78 mg/dL).  Liver Function Tests: Recent Labs  Lab 06/05/23 2253  AST 19  ALT 14  ALKPHOS 70  BILITOT 0.7  PROT 7.1  ALBUMIN 3.7    CBG: Recent Labs  Lab 06/06/23 2024 06/07/23 0150 06/07/23 0507 06/07/23 0837 06/07/23 1226  GLUCAP 129* 117* 115* 127* 202*     No results found for this or any previous visit (from the past 240 hours).       Radiology Studies: CT  ABDOMEN PELVIS W CONTRAST Result Date: 06/06/2023 CLINICAL DATA:  Abdominal pain, acute, nonlocalized.  Vomiting EXAM: CT ABDOMEN AND PELVIS WITH CONTRAST TECHNIQUE: Multidetector CT imaging of the abdomen and pelvis was performed using the standard protocol following bolus administration of intravenous contrast. RADIATION DOSE REDUCTION: This exam was performed according to the departmental dose-optimization program which includes automated exposure control, adjustment of the mA and/or kV according to patient size and/or use of iterative reconstruction technique. CONTRAST:  75mL OMNIPAQUE  IOHEXOL  350 MG/ML SOLN COMPARISON:  CT abdomen pelvis 08/18/2018 trauma PET CT 02/13/2023 FINDINGS: Lower chest: Persistent at small volume hiatal hernia. Gastroesophageal wall thickening. Hepatobiliary: No focal liver abnormality. No gallstones, gallbladder wall thickening, or pericholecystic fluid. No biliary dilatation. Pancreas: No focal lesion. Normal pancreatic contour. No surrounding inflammatory changes. No main pancreatic ductal dilatation. Spleen: Normal in size without focal abnormality. Adrenals/Urinary Tract: No adrenal nodule bilaterally. Bilateral kidneys enhance symmetrically. Parapelvic right simple cyst. Simple renal cysts, in the absence of clinically indicated signs/symptoms, require no independent follow-up. No hydronephrosis. No hydroureter.  No nephroureterolithiasis. The urinary bladder is unremarkable. On delayed imaging, there is no urothelial wall thickening and there are no filling defects in the opacified portions of the bilateral collecting systems or ureters. Stomach/Bowel: Stomach is within normal limits. No evidence of bowel wall thickening or dilatation. Second portion of the duodenum diverticula. Colonic diverticulosis. Appendix appears normal. Vascular/Lymphatic: No abdominal aorta or iliac aneurysm. Severe atherosclerotic plaque of the aorta and its branches. No abdominal, pelvic, or inguinal  lymphadenopathy. Reproductive: Status post hysterectomy. No adnexal masses. Other: No intraperitoneal free fluid. No intraperitoneal free gas. No organized fluid collection. Musculoskeletal: No abdominal wall hernia or abnormality. No suspicious lytic or blastic osseous lesions. No acute displaced fracture. Grade 1 anterolisthesis of L4 on L5. IMPRESSION: 1. Persistent at small volume hiatal hernia with gastroesophageal wall thickening. Correlate with signs and symptoms of esophagitis. Underlying mass not excluded. Consider direct visualization. 2. Colonic diverticulosis with no acute diverticulitis. 3.  Aortic Atherosclerosis (ICD10-I70.0). Electronically Signed   By: Morgane  Naveau M.D.   On: 06/06/2023 00:43        Scheduled Meds:  insulin  aspart  0-9 Units Subcutaneous Q4H   pantoprazole  (PROTONIX ) IV  40 mg Intravenous BID   potassium chloride   40 mEq Oral Once   Continuous Infusions:   LOS: 1 day       Maize Brittingham, MD [facility]   To contact the attending provider between 7A-7P or the covering provider during after hours 7P-7A, please log into the web site [email or web address] and access using universal [place] password for that web site. If you do not have the password, please call the hospital operator.  06/07/2023, 1:39 PM

## 2023-06-08 ENCOUNTER — Other Ambulatory Visit (HOSPITAL_COMMUNITY): Payer: Self-pay

## 2023-06-08 DIAGNOSIS — R112 Nausea with vomiting, unspecified: Secondary | ICD-10-CM | POA: Diagnosis not present

## 2023-06-08 LAB — BASIC METABOLIC PANEL WITH GFR
Anion gap: 6 (ref 5–15)
BUN: 8 mg/dL (ref 8–23)
CO2: 30 mmol/L (ref 22–32)
Calcium: 8.6 mg/dL — ABNORMAL LOW (ref 8.9–10.3)
Chloride: 98 mmol/L (ref 98–111)
Creatinine, Ser: 0.69 mg/dL (ref 0.44–1.00)
GFR, Estimated: >60 mL/min (ref >60)
Glucose, Bld: 118 mg/dL — ABNORMAL HIGH (ref 70–99)
Potassium: 3.4 mmol/L — ABNORMAL LOW (ref 3.5–5.1)
Sodium: 134 mmol/L — ABNORMAL LOW (ref 135–145)

## 2023-06-08 LAB — CBC
HCT: 37.8 % (ref 36.0–46.0)
Hemoglobin: 12.1 g/dL (ref 12.0–15.0)
MCH: 26.9 pg (ref 26.0–34.0)
MCHC: 32 g/dL (ref 30.0–36.0)
MCV: 84.2 fL (ref 80.0–100.0)
Platelets: 251 10*3/uL (ref 150–400)
RBC: 4.49 MIL/uL (ref 3.87–5.11)
RDW: 14.8 % (ref 11.5–15.5)
WBC: 7.8 10*3/uL (ref 4.0–10.5)
nRBC: 0 % (ref 0.0–0.2)

## 2023-06-08 LAB — GLUCOSE, CAPILLARY
Glucose-Capillary: 120 mg/dL — ABNORMAL HIGH (ref 70–99)
Glucose-Capillary: 146 mg/dL — ABNORMAL HIGH (ref 70–99)
Glucose-Capillary: 153 mg/dL — ABNORMAL HIGH (ref 70–99)

## 2023-06-08 MED ORDER — PANTOPRAZOLE SODIUM 40 MG PO TBEC
40.0000 mg | DELAYED_RELEASE_TABLET | Freq: Two times a day (BID) | ORAL | 0 refills | Status: DC
Start: 2023-06-08 — End: 2023-06-08
  Filled 2023-06-08: qty 60, 30d supply, fill #0

## 2023-06-08 MED ORDER — POTASSIUM CHLORIDE 20 MEQ PO PACK
40.0000 meq | PACK | Freq: Once | ORAL | Status: AC
  Administered 2023-06-08: 40 meq via ORAL
  Filled 2023-06-08: qty 2

## 2023-06-08 NOTE — Discharge Instructions (Signed)
 Follow with Primary MD [facility] [place], [place] in 7 days   Get CBC, CMP, checked  by Primary MD next visit.    Activity: As tolerated with Full fall precautions use walker/cane & assistance as needed   Disposition Home    Diet: Soft diet   On your next visit with your primary care physician please Get Medicines reviewed and adjusted.   Please request your Prim.MD to go over all Hospital Tests and Procedure/Radiological results at the follow up, please get all Hospital records sent to your Prim MD by signing hospital release before you go home.   If you experience worsening of your admission symptoms, develop shortness of breath, life threatening emergency, suicidal or homicidal thoughts you must seek medical attention immediately by calling 911 or calling your MD immediately  if symptoms less severe.  You Must read complete instructions/literature along with all the possible adverse reactions/side effects for all the Medicines you take and that have been prescribed to you. Take any new Medicines after you have completely understood and accpet all the possible adverse reactions/side effects.   Do not drive, operating heavy machinery, perform activities at heights, swimming or participation in water activities or provide baby sitting services if your were admitted for syncope or siezures until you have seen by Primary MD or a Neurologist and advised to do so again.  Do not drive when taking Pain medications.    Do not take more than prescribed Pain, Sleep and Anxiety Medications  Special Instructions: If you have smoked or chewed Tobacco  in the last 2 yrs please stop smoking, stop any regular Alcohol  and or any Recreational drug use.  Wear Seat belts while driving.   Please note  You were cared for by a hospitalist during your hospital stay. If you have any questions about your discharge medications or the care you received while you were in the hospital after you are  discharged, you can call the unit and asked to speak with the hospitalist on call if the hospitalist that took care of you is not available. Once you are discharged, your primary care physician will handle any further medical issues. Please note that NO REFILLS for any discharge medications will be authorized once you are discharged, as it is imperative that you return to your primary care physician (or establish a relationship with a primary care physician if you do not have one) for your aftercare needs so that they can reassess your need for medications and monitor your lab values.

## 2023-06-08 NOTE — Discharge Summary (Signed)
 Physician Discharge Summary  CRUZ DEVILLA ZOX:[id number] DOB: 1940-01-15 DOA: 06/05/2023  PCP: [facility], [place]  Admit date: 06/05/2023 Discharge date: 06/08/2023  Admitted From: (Home) Disposition:  (Home)  Recommendations for Outpatient Follow-up:  Follow up with PCP in 1-2 weeks Please obtain BMP/CBC in one week  Diet recommendation: Heart Healthy / Carb Modified, soft diet, pure even if needed, please ensure eating smaller portions, more frequent, and stay setting up at least 60 minutes after each meal.  Brief/Interim Summary:  Maria Clay is a 83 y.o. female with medical history significant for cognitive impairment, reported mild dementia, type 2 diabetes with diabetic polyneuropathy, hyperlipidemia, chronic gastric ulcer, presents to the ER from home via EMS due to nausea, vomiting, and possible blood in emesis.  - Workup in ED significant for stable hemoglobin, she was seen by GI and admitted for further workup.  .    Nausea, vomiting History of gastric ulcer CT scan findings concerning for esophagitis Questionable hematemesis. - Underlying history of gastroparesis, hiatal hernia repair with Nissen fundoplication, she reports intermittent nausea and vomiting, but not to a such degree.  Imaging were concerning for esophagitis, does appear and has not been taking her PPI recently as well, so she was admitted for further management and observation, kept on IV Protonix , and Carafate  as well, she was on clear liquid diet and advance gradually, currently she is tolerating full liquid diet, GI, she should be stable for discharge today, I have discussed with family to encourage her to eat smaller portion, and frequently, and to remain sitting up at least 1 hour after each meal, to ensure her compliance with PPI, it does appear she has ample supply of PPI at home but she has been refusing to take them as she thought she would not need it, she was counseled about that. -  Initial concern for upper GI bleed and hematemesis, but it does appear her vomiting was clear liquid, with no blood, hemoglobin trending down from 14.6-12.4, it is stable at 12 range for few days, and this initial drop likely dilutional.    Leukocytosis, suspect reactive UA negative for pyuria No reported upper respiratory infection symptoms No reported subjective fevers or chills. Continue IV PPI twice daily Resolved   Type 2 diabetes with hyperglycemia Last hemoglobin A1c 6.9 on 06/06/2023 Continue with home regimen   Cognitive impairment History of mild dementia Reorient as needed   Hyperlipidemia -Continue with home statin   Hypokalemia -replaced   Hypertension -Blood pressure is acceptable, continue  with home regimen     Discharge Diagnoses:  Principal Problem:   Upper GI bleed Active Problems:   Nausea and vomiting    Discharge Instructions   Allergies as of 06/08/2023       Reactions   Tramadol Other (See Comments), Itching, Nausea Only   Codeine Nausea Only   Donepezil Other (See Comments)        Medication List     STOP taking these medications    omeprazole 40 MG capsule Commonly known as: PRILOSEC       TAKE these medications    albuterol 108 (90 Base) MCG/ACT inhaler Commonly known as: VENTOLIN HFA Inhale 1-2 puffs into the lungs as needed for wheezing or shortness of breath.   lisinopril -hydrochlorothiazide  20-12.5 MG tablet Commonly known as: ZESTORETIC  Take 1 tablet by mouth daily.   oxybutynin 10 MG 24 hr tablet Commonly known as: DITROPAN-XL Take 10 mg by mouth daily.   pantoprazole  40  MG tablet Commonly known as: PROTONIX  Take 1 tablet (40 mg total) by mouth 2 (two) times daily.   rivastigmine  3 MG capsule Commonly known as: EXELON  Take 3 mg by mouth 2 (two) times daily.   rosuvastatin  10 MG tablet Commonly known as: CRESTOR  Take 10 mg by mouth.   solifenacin 5 MG tablet Commonly known as: VESICARE Take 5 mg by  mouth daily.   Xultophy 100-3.6 UNIT-MG/ML Sopn Generic drug: Insulin  Degludec-Liraglutide Inject 24 Units into the skin daily as needed (insulin  control.).        Allergies  Allergen Reactions   Tramadol Other (See Comments), Itching and Nausea Only   Codeine Nausea Only   Donepezil Other (See Comments)    Consultations: GI   Procedures/Studies: CT ABDOMEN PELVIS W CONTRAST Result Date: 06/06/2023 CLINICAL DATA:  Abdominal pain, acute, nonlocalized.  Vomiting EXAM: CT ABDOMEN AND PELVIS WITH CONTRAST TECHNIQUE: Multidetector CT imaging of the abdomen and pelvis was performed using the standard protocol following bolus administration of intravenous contrast. RADIATION DOSE REDUCTION: This exam was performed according to the departmental dose-optimization program which includes automated exposure control, adjustment of the mA and/or kV according to patient size and/or use of iterative reconstruction technique. CONTRAST:  75mL OMNIPAQUE  IOHEXOL  350 MG/ML SOLN COMPARISON:  CT abdomen pelvis 08/18/2018 trauma PET CT 02/13/2023 FINDINGS: Lower chest: Persistent at small volume hiatal hernia. Gastroesophageal wall thickening. Hepatobiliary: No focal liver abnormality. No gallstones, gallbladder wall thickening, or pericholecystic fluid. No biliary dilatation. Pancreas: No focal lesion. Normal pancreatic contour. No surrounding inflammatory changes. No main pancreatic ductal dilatation. Spleen: Normal in size without focal abnormality. Adrenals/Urinary Tract: No adrenal nodule bilaterally. Bilateral kidneys enhance symmetrically. Parapelvic right simple cyst. Simple renal cysts, in the absence of clinically indicated signs/symptoms, require no independent follow-up. No hydronephrosis. No hydroureter.  No nephroureterolithiasis. The urinary bladder is unremarkable. On delayed imaging, there is no urothelial wall thickening and there are no filling defects in the opacified portions of the bilateral  collecting systems or ureters. Stomach/Bowel: Stomach is within normal limits. No evidence of bowel wall thickening or dilatation. Second portion of the duodenum diverticula. Colonic diverticulosis. Appendix appears normal. Vascular/Lymphatic: No abdominal aorta or iliac aneurysm. Severe atherosclerotic plaque of the aorta and its branches. No abdominal, pelvic, or inguinal lymphadenopathy. Reproductive: Status post hysterectomy. No adnexal masses. Other: No intraperitoneal free fluid. No intraperitoneal free gas. No organized fluid collection. Musculoskeletal: No abdominal wall hernia or abnormality. No suspicious lytic or blastic osseous lesions. No acute displaced fracture. Grade 1 anterolisthesis of L4 on L5. IMPRESSION: 1. Persistent at small volume hiatal hernia with gastroesophageal wall thickening. Correlate with signs and symptoms of esophagitis. Underlying mass not excluded. Consider direct visualization. 2. Colonic diverticulosis with no acute diverticulitis. 3.  Aortic Atherosclerosis (ICD10-I70.0). Electronically Signed   By: Morgane  Naveau M.D.   On: 06/06/2023 00:43     Subjective: No significant events overnight, she is eager to go home, no nausea or vomiting yesterday, tolerating her full liquid diet.  Discharge Exam: Vitals:   06/07/23 2332 06/08/23 0749  BP: (!) 157/43 (!) 144/70  Pulse: 74 70  Resp: (!) 23 (!) 23  Temp: 98.7 F (37.1 C) 97.9 F (36.6 C)  SpO2: 90% 94%   Vitals:   06/07/23 1708 06/07/23 1900 06/07/23 2332 06/08/23 0749  BP: (!) 174/60 (!) 173/79 (!) 157/43 (!) 144/70  Pulse: 76 72 74 70  Resp: 19 20 (!) 23 (!) 23  Temp: 97.8 F (36.6 C) 98.9 F (37.2  C) 98.7 F (37.1 C) 97.9 F (36.6 C)  TempSrc: Oral Oral Oral Oral  SpO2: 95% 94% 90% 94%  Weight:      Height:        General: Pt is alert, awake, not in acute distress Cardiovascular: RRR, S1/S2 +, no rubs, no gallops Respiratory: CTA bilaterally, no wheezing, no rhonchi Abdominal: Soft, NT, ND,  bowel sounds + Extremities: no edema, no cyanosis    The results of significant diagnostics from this hospitalization (including imaging, microbiology, ancillary and laboratory) are listed below for reference.     Microbiology: No results found for this or any previous visit (from the past 240 hours).   Labs: BNP (last 3 results) No results for input(s): "BNP" in the last 8760 hours. Basic Metabolic Panel: Recent Labs  Lab 06/05/23 2253 06/06/23 0420 06/07/23 1035 06/08/23 0232  NA 131* 136 136 134*  K 3.7 3.5 3.3* 3.4*  CL 88* 95* 100 98  CO2 28 30 26 30   GLUCOSE 250* 164* 129* 118*  BUN 22 19 15 8   CREATININE 1.00 0.84 0.78 0.69  CALCIUM  9.6 9.0 8.7* 8.6*  MG  --  2.0  --   --   PHOS  --  3.7  --   --    Liver Function Tests: Recent Labs  Lab 06/05/23 2253  AST 19  ALT 14  ALKPHOS 70  BILITOT 0.7  PROT 7.1  ALBUMIN 3.7   Recent Labs  Lab 06/05/23 2253  LIPASE 26   No results for input(s): "AMMONIA" in the last 168 hours. CBC: Recent Labs  Lab 06/05/23 2253 06/06/23 0420 06/07/23 1035 06/08/23 0232  WBC 14.4* 14.0* 7.0 7.8  NEUTROABS 13.0*  --   --   --   HGB 15.5* 14.6 12.4 12.1  HCT 47.6* 44.7 38.9 37.8  MCV 82.8 82.2 84.9 84.2  PLT 381 339 243 251   Cardiac Enzymes: No results for input(s): "CKTOTAL", "CKMB", "CKMBINDEX", "TROPONINI" in the last 168 hours. BNP: Invalid input(s): "POCBNP" CBG: Recent Labs  Lab 06/07/23 1226 06/07/23 1655 06/07/23 2004 06/08/23 0444 06/08/23 0748  GLUCAP 202* 171* 140* 120* 146*   D-Dimer No results for input(s): "DDIMER" in the last 72 hours. Hgb A1c Recent Labs    06/06/23 0420  HGBA1C 6.9*   Lipid Profile No results for input(s): "CHOL", "HDL", "LDLCALC", "TRIG", "CHOLHDL", "LDLDIRECT" in the last 72 hours. Thyroid  function studies No results for input(s): "TSH", "T4TOTAL", "T3FREE", "THYROIDAB" in the last 72 hours.  Invalid input(s): "FREET3" Anemia work up No results for input(s):  "VITAMINB12", "FOLATE", "FERRITIN", "TIBC", "IRON", "RETICCTPCT" in the last 72 hours. Urinalysis    Component Value Date/Time   COLORURINE AMBER (A) 06/06/2023 0446   APPEARANCEUR HAZY (A) 06/06/2023 0446   LABSPEC >1.046 (H) 06/06/2023 0446   PHURINE 5.0 06/06/2023 0446   GLUCOSEU 50 (A) 06/06/2023 0446   HGBUR SMALL (A) 06/06/2023 0446   BILIRUBINUR NEGATIVE 06/06/2023 0446   KETONESUR 20 (A) 06/06/2023 0446   PROTEINUR 100 (A) 06/06/2023 0446   NITRITE NEGATIVE 06/06/2023 0446   LEUKOCYTESUR NEGATIVE 06/06/2023 0446   Sepsis Labs Recent Labs  Lab 06/05/23 2253 06/06/23 0420 06/07/23 1035 06/08/23 0232  WBC 14.4* 14.0* 7.0 7.8   Microbiology No results found for this or any previous visit (from the past 240 hours).   Time coordinating discharge: Over 30 minutes  SIGNED:   Seena Dadds, MD  [facility] 06/08/2023, 10:30 AM Pager   If 7PM-7AM, please contact night-coverage [email or web address]

## 2023-06-11 ENCOUNTER — Ambulatory Visit: Payer: Medicare HMO | Admitting: Physician Assistant

## 2023-06-14 ENCOUNTER — Ambulatory Visit: Admitting: Physician Assistant

## 2023-06-14 ENCOUNTER — Encounter: Payer: Self-pay | Admitting: Physician Assistant

## 2023-06-14 VITALS — BP 135/76 | HR 110 | Resp 18 | Ht 64.5 in | Wt 153.0 lb

## 2023-06-14 DIAGNOSIS — F03A0 Unspecified dementia, mild, without behavioral disturbance, psychotic disturbance, mood disturbance, and anxiety: Secondary | ICD-10-CM

## 2023-06-14 NOTE — Patient Instructions (Addendum)
 It was a pleasure to see you today at our office.   Recommendations:  Continue B12 Recommend increasing rivastigmine  to 4.5 mg twice a day  Follow up in Jan 12 at 11:30   For psychiatric meds, mood meds: Please have your primary care physician manage these medications.  If you have any severe symptoms of a stroke, or other severe issues such as confusion,severe chills or fever, etc call 911 or go to the ER as you may need to be evaluated further   For assessment of decision of mental capacity and competency:  Call Dr. Laverne Potter, geriatric psychiatrist at [phone]  Counseling regarding caregiver distress, including caregiver depression, anxiety and issues regarding community resources, adult day care programs, adult living facilities, or memory care questions:  please contact your  Primary Doctor's Social Worker   Whom to call: Memory  decline, memory medications: Call our office [phone]    [email or web address]   RECOMMENDATIONS FOR ALL PATIENTS WITH MEMORY PROBLEMS: 1. Continue to exercise (Recommend 30 minutes of walking everyday, or 3 hours every week) 2. Increase social interactions - continue going to [place] and enjoy social gatherings with friends and family 3. Eat healthy, avoid fried foods and eat more fruits and vegetables 4. Maintain adequate blood pressure, blood sugar, and blood cholesterol level. Reducing the risk of stroke and cardiovascular disease also helps promoting better memory. 5. Avoid stressful situations. Live a simple life and avoid aggravations. Organize your time and prepare for the next day in anticipation. 6. Sleep well, avoid any interruptions of sleep and avoid any distractions in the bedroom that may interfere with adequate sleep quality 7. Avoid sugar, avoid sweets as there is a strong link between excessive sugar intake, diabetes, and cognitive impairment We discussed the Mediterranean  diet, which has been shown to help patients reduce the risk of progressive memory disorders and reduces cardiovascular risk. This includes eating fish, eat fruits and green leafy vegetables, nuts like almonds and hazelnuts, walnuts, and also use olive oil. Avoid fast foods and fried foods as much as possible. Avoid sweets and sugar as sugar use has been linked to worsening of memory function.  There is always a concern of gradual progression of memory problems. If this is the case, then we may need to adjust level of care according to patient needs. Support, both to the patient and caregiver, should then be put into place.      You have been referred for a neuropsychological evaluation (i.e., evaluation of memory and thinking abilities). Please bring someone with you to this appointment if possible, as it is helpful for the doctor to hear from both you and another adult who knows you well. Please bring eyeglasses and hearing aids if you wear them.    The evaluation will take approximately 3 hours and has two parts:   The first part is a clinical interview with the neuropsychologist (Dr. Kitty Perkins or Dr. Annette Barters). During the interview, the neuropsychologist will speak with you and the individual you brought to the appointment.    The second part of the evaluation is testing with the doctor's technician Bernabe Brew or Burdette Carolin). During the testing, the technician will ask you to remember different types of material, solve problems, and answer some questionnaires. Your family member will not be present for this portion of the evaluation.   Please note: We must reserve several hours of the neuropsychologist's time and the psychometrician's time for your evaluation appointment. As such, there is a No-Show fee of $100.  If you are unable to attend any of your appointments, please contact our office as soon as possible to reschedule.      DRIVING: Regarding driving, in patients with progressive memory problems, driving  will be impaired. We advise to have someone else do the driving if trouble finding directions or if minor accidents are reported. Independent driving assessment is available to determine safety of driving.   If you are interested in the driving assessment, you can contact the following:  [employer] in [place] [phone]  [facility] [phone]  [facility] [phone]  [facility] [phone] or [phone]   FALL PRECAUTIONS: Be cautious when walking. Scan the area for obstacles that may increase the risk of trips and falls. When getting up in the mornings, sit up at the edge of the bed for a few minutes before getting out of bed. Consider elevating the bed at the head end to avoid drop of blood pressure when getting up. Walk always in a well-lit room (use night lights in the walls). Avoid area rugs or power cords from appliances in the middle of the walkways. Use a walker or a cane if necessary and consider physical therapy for balance exercise. Get your eyesight checked regularly.  FINANCIAL OVERSIGHT: Supervision, especially oversight when making financial decisions or transactions is also recommended.  HOME SAFETY: Consider the safety of the kitchen when operating appliances like stoves, microwave oven, and blender. Consider having supervision and share cooking responsibilities until no longer able to participate in those. Accidents with firearms and other hazards in the house should be identified and addressed as well.   ABILITY TO BE LEFT ALONE: If patient is unable to contact 911 operator, consider using LifeLine, or when the need is there, arrange for someone to stay with patients. Smoking is a fire hazard, consider supervision or cessation. Risk of wandering should be assessed by caregiver and if detected at any point, supervision and safe proof recommendations should be instituted.  MEDICATION SUPERVISION: Inability to  self-administer medication needs to be constantly addressed. Implement a mechanism to ensure safe administration of the medications.      Mediterranean Diet A Mediterranean diet refers to food and lifestyle choices that are based on the traditions of countries located on the [employer]. This way of eating has been shown to help prevent certain conditions and improve outcomes for people who have chronic diseases, like kidney disease and heart disease. What are tips for following this plan? Lifestyle  Cook and eat meals together with your family, when possible. Drink enough fluid to keep your urine clear or pale yellow. Be physically active every day. This includes: Aerobic exercise like running or swimming. Leisure activities like gardening, walking, or housework. Get 7-8 hours of sleep each night. If recommended by your health care provider, drink red wine in moderation. This means 1 glass a day for nonpregnant women and 2 glasses a day for men. A glass of wine equals 5 oz (150 mL). Reading food labels  Check the serving size of packaged foods. For foods such as rice and pasta, the serving size refers to the amount of cooked product, not dry. Check the total fat in packaged foods. Avoid foods that have saturated fat or trans fats. Check the ingredients list for added sugars, such as corn syrup. Shopping  At the grocery store, buy most of your food from the areas near the walls of the store. This includes: Fresh fruits and vegetables (produce). Grains, beans,  nuts, and seeds. Some of these may be available in unpackaged forms or large amounts (in bulk). Fresh seafood. Poultry and eggs. Low-fat dairy products. Buy whole ingredients instead of prepackaged foods. Buy fresh fruits and vegetables in-season from local farmers markets. Buy frozen fruits and vegetables in resealable bags. If you do not have access to quality fresh seafood, buy precooked frozen shrimp or canned fish, such  as tuna, salmon, or sardines. Buy small amounts of raw or cooked vegetables, salads, or olives from the deli or salad bar at your store. Stock your pantry so you always have certain foods on hand, such as olive oil, canned tuna, canned tomatoes, rice, pasta, and beans. Cooking  Cook foods with extra-virgin olive oil instead of using butter or other vegetable oils. Have meat as a side dish, and have vegetables or grains as your main dish. This means having meat in small portions or adding small amounts of meat to foods like pasta or stew. Use beans or vegetables instead of meat in common dishes like chili or lasagna. Experiment with different cooking methods. Try roasting or broiling vegetables instead of steaming or sauteing them. Add frozen vegetables to soups, stews, pasta, or rice. Add nuts or seeds for added healthy fat at each meal. You can add these to yogurt, salads, or vegetable dishes. Marinate fish or vegetables using olive oil, lemon juice, garlic, and fresh herbs. Meal planning  Plan to eat 1 vegetarian meal one day each week. Try to work up to 2 vegetarian meals, if possible. Eat seafood 2 or more times a week. Have healthy snacks readily available, such as: Vegetable sticks with hummus. Greek yogurt. Fruit and nut trail mix. Eat balanced meals throughout the week. This includes: Fruit: 2-3 servings a day Vegetables: 4-5 servings a day Low-fat dairy: 2 servings a day Fish, poultry, or lean meat: 1 serving a day Beans and legumes: 2 or more servings a week Nuts and seeds: 1-2 servings a day Whole grains: 6-8 servings a day Extra-virgin olive oil: 3-4 servings a day Limit red meat and sweets to only a few servings a month What are my food choices? Mediterranean diet Recommended Grains: Whole-grain pasta. Brown rice. Bulgar wheat. Polenta. Couscous. Whole-wheat bread. Dwyane Glad. Vegetables: Artichokes. Beets. Broccoli. Cabbage. Carrots. Eggplant. Green beans. Chard.  Kale. Spinach. Onions. Leeks. Peas. Squash. Tomatoes. Peppers. Radishes. Fruits: Apples. Apricots. Avocado. Berries. Bananas. Cherries. Dates. Figs. Grapes. Lemons. Melon. Oranges. Peaches. Plums. Pomegranate. Meats and other protein foods: Beans. Almonds. Sunflower seeds. Pine nuts. Peanuts. Cod. Salmon. Scallops. Shrimp. Tuna. Tilapia. Clams. Oysters. Eggs. Dairy: Low-fat milk. Cheese. Greek yogurt. Beverages: Water. Red wine. Herbal tea. Fats and oils: Extra virgin olive oil. Avocado oil. Grape seed oil. Sweets and desserts: [place] yogurt with honey. Baked apples. Poached pears. Trail mix. Seasoning and other foods: Basil. Cilantro. Coriander. Cumin. Mint. Parsley. Sage. Rosemary. Tarragon. Garlic. Oregano. Thyme. Pepper. Balsalmic vinegar. Tahini. Hummus. Tomato sauce. Olives. Mushrooms. Limit these Grains: Prepackaged pasta or rice dishes. Prepackaged cereal with added sugar. Vegetables: Deep fried potatoes (french fries). Fruits: Fruit canned in syrup. Meats and other protein foods: Beef. Pork. Lamb. Poultry with skin. Hot dogs. Helene Loader. Dairy: Ice cream. Sour cream. Whole milk. Beverages: Juice. Sugar-sweetened soft drinks. Beer. Liquor and spirits. Fats and oils: Butter. Canola oil. Vegetable oil. Beef fat (tallow). Lard. Sweets and desserts: Cookies. Cakes. Pies. Candy. Seasoning and other foods: Mayonnaise. Premade sauces and marinades. The items listed may not be a complete list. Talk with your dietitian about what  dietary choices are right for you. Summary The Mediterranean diet includes both food and lifestyle choices. Eat a variety of fresh fruits and vegetables, beans, nuts, seeds, and whole grains. Limit the amount of red meat and sweets that you eat. Talk with your health care provider about whether it is safe for you to drink red wine in moderation. This means 1 glass a day for nonpregnant women and 2 glasses a day for men. A glass of wine equals 5 oz (150 mL). This information  is not intended to replace advice given to you by your health care provider. Make sure you discuss any questions you have with your health care provider. Document Released: 08/18/2015 Document Revised: 09/20/2015 Document Reviewed: 08/18/2015 Elsevier Interactive Patient Education  2017 [employer].    Labs today [address] [facility] [phone] MRI

## 2023-06-14 NOTE — Progress Notes (Signed)
 Assessment/Plan:   Mild dementia, concern for Alzheimer's disease  Maria Clay is a very pleasant 83 y.o. RH female with a history of hypertension, hyperlipidemia, DM2 with neuropathy and retinopathy, COPD, PAD, depression, IBS, OAB  and a diagnosis of mild dementia of unclear etiology, but with concern for Alzheimer's disease early stages per neuropsych evaluation on 10/15/2022 seen today in follow up for memory loss. Patient is currently on rivastigmine  3 mg twice daily as per PCP. MMSE today is 18/30, slight cognitive decline noted.She is able to participate in her ADLs, drives short distances.  Mood is good.    Follow up in  6 months. Repeat neuropsych evaluation in 6 to 12 months for diagnostic clarity and disease trajectory Continue rivastigmine , recommend increasing to 4.5 mg twice daily as per PCP, side effects discussed  Continue to replenish B12 Recommend good control of her cardiovascular risk factors Continue to control mood as per PCP Monitor driving     Subjective:    This patient is accompanied in the office by her daughter who supplements the history.  Previous records as well as any outside records available were reviewed prior to todays visit. Patient was last seen on 12/11/2022.   Any changes in memory since last visit? "Depends on the day, and days may be better than others"-daughter says.  She continues to have difficulty with STM, LTM is good.  She likes to read extensively as before, she does not enjoy doing brain stimulating exercises. repeats oneself?  Endorsed, a little bit more. Disoriented when walking into a room? Denies     Leaving objects?  May misplace things but not in unusual places   Wandering behavior?  denies   Any personality changes since last visit?  Denies.  During her hospitalization for her esophagitis she had moments of mood changes, but these resolved. Any worsening depression?:  Denies.   Hallucinations or paranoia?  Denies.    Seizures? denies    Any sleep changes?  Sleeps well.  Denies vivid dreams, REM behavior or sleepwalking   Sleep apnea?   Denies.   Any hygiene concerns? Denies.  Independent of bathing and dressing?  Endorsed  Does the patient needs help with medications?  Daughter is in charge Who is in charge of the finances?  Son is in charge Any changes in appetite?  She does not eat  very well especially recently when she was admitted with esophagitis.  She does not drink protein drinks. (See below). She admits not drinking enough water. Patient have trouble swallowing?  She had a recent presentation to the hospital 06/08/2023 for esophagitis requiring IV PPI with improvement of symptoms.  No further nausea or vomiting Does the patient cook? No  Any headaches?   Denies.   Any vision changes? Denies  Chronic back pain  denies.   Ambulates with difficulty?  She uses a cane to ambulate for stability.  Recent falls or head injuries? Denies.     Unilateral weakness, numbness or tingling? denies   Any tremors?  Denies   Any anosmia?  Denies   Any incontinence of urine?  Endorsed, due to OAB, wears diapers.   Any bowel dysfunction?   Denies      Patient lives alone, her niece is moving in with her soon.      Does the patient drive?  Continues to drive, denies any new issues.  Her daughter did place "41 " on her phone and she noticed that a couple of times she  turned around and went back home, she wonders if the patient became lost and returned to the house.  Initial visit 08/07/2022 How long did patient have memory difficulties?  Gradually , for at least 3 years . Patient has difficulty remembering recent conversations and people names, new information. She began taking donepezil less than 1 y ago but " the memory was worsening with the medicine". This was changed to rivastigmine , and it seems that "memory is not getting worse". She likes to read extensively Has not been doing brain games repeats oneself?   Endorsed Disoriented when walking into a room?  Denies   Leaving objects in unusual places?   denies   Wandering behavior? denies   Any personality changes ? denies   Any history of depression?: denies   Hallucinations or paranoia?  denies   Seizures? denies    Any sleep changes?  No sleep changes. Denies  vivid dreams, REM behavior or sleepwalking   Sleep apnea? denies   Any hygiene concerns?  denies   Independent of bathing and dressing?  Endorsed  Does the patient need help with medications? Daughter is in charge for the last 3 months because she may have forgotten doses.  Who is in charge of the finances? Daughter and son  is in charge     Any changes in appetite?  "I don't know what I ate or if I ate". She admits to not drinking enough water  Patient have trouble swallowing?  denies   Does the patient cook?  Yes, denies accidents Any headaches?  denies   Chronic back pain?  denies   Ambulates with difficulty? Needs  a cane to ambulate    Recent falls or head injuries? In April 2024 she had a mechanical fall with the carpet. She had ankle fracture , now fully recovered   Vision changes? No  Stroke like symptoms?  denies   Any tremors?  denies   Any anosmia?  denies   Any incontinence of urine? Endorsed due to OAB , uses Depends   Any bowel dysfunction? denies      Patient [place] History of heavy alcohol intake? denies   History of heavy tobacco use? denies   Family history of dementia?  Mo had dementia ? Type  Does patient drive? 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       Neuropsych evaluation 10/15/2022 Briefly, results suggested severe impairment surrounding both encoding (i.e., learning) and delayed retrieval aspects of memory. Additional  impairments were exhibited across cognitive flexibility and clock drawing, while relative weaknesses were exhibited across semantic fluency and recognition/consolidation aspects of memory. Further performance variability was exhibited across basic attention. The etiology for her somewhat mild dementia presentation is unclear at the present time. With that being said, I do feel that concerns for an underlying neurodegenerative illness, namely Alzheimer's disease, are reasonable. Maria Clay did not benefit from repeated exposure to novel information and was fully amnestic (i.e., 0% retention) across 2/3 memory tasks. She also performed quite poorly across 2/3 yes/no recognition trials. 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 Further weakness surrounding semantic fluency, cognitive flexibility, and clock drawing would follow fairly  typical disease progression, escalating concerns. Strong confrontation naming performances are certainly encouraging. While I cannot rule in Alzheimer's disease with strong confidence, this illness certainly cannot be ruled out and there is concern for its presence in earlier stages.    PREVIOUS MEDICATIONS: Donepezil (itching and nausea)  CURRENT MEDICATIONS:  Outpatient Encounter Medications as of 06/14/2023  Medication Sig   albuterol (VENTOLIN HFA) 108 (90 Base) MCG/ACT inhaler Inhale 1-2 puffs into the lungs as needed for wheezing or shortness of breath.   lisinopril -hydrochlorothiazide  (ZESTORETIC ) 20-12.5 MG tablet Take 1 tablet by mouth daily.   oxybutynin (DITROPAN-XL) 10 MG 24 hr tablet Take 10 mg by mouth daily.   rivastigmine  (EXELON ) 3 MG capsule Take 3 mg by mouth 2 (two) times daily.   rosuvastatin  (CRESTOR ) 10 MG tablet Take 10 mg by mouth.   solifenacin (VESICARE) 5 MG tablet Take 5 mg by mouth daily.   XULTOPHY 100-3.6 UNIT-MG/ML SOPN Inject  24 Units into the skin daily as needed (insulin  control.).   No facility-administered encounter medications on file as of 06/14/2023.        No data to display            08/07/2022    8:00 PM  Montreal Cognitive Assessment   Visuospatial/ Executive (0/5) 2  Naming (0/3) 3  Attention: Read list of digits (0/2) 2  Attention: Read list of letters (0/1) 1  Attention: Serial 7 subtraction starting at 100 (0/3) 1  Language: Repeat phrase (0/2) 1  Language : Fluency (0/1) 1  Abstraction (0/2) 2  Delayed Recall (0/5) 0  Orientation (0/6) 4  Total 17  Adjusted Score (based on education) 18    Objective:     PHYSICAL EXAMINATION:    VITALS:   Vitals:   06/14/23 1108  BP: 135/76  Pulse: (!) 110  Resp: 18  SpO2: 99%  Weight: 153 lb (69.4 kg)  Height: 5' 4.5" (1.638 m)    GEN:  The patient appears stated age and is in NAD. HEENT:  Normocephalic, atraumatic.   Neurological examination:  General: NAD, well-groomed, appears stated age. Orientation: The patient is alert. Oriented to person, place and not to date Cranial nerves: There is good facial symmetry.The speech is fluent and clear. No aphasia or dysarthria. Fund of knowledge is appropriate. Recent and remote memory are impaired. Attention and concentration are reduced. Able to name objects and repeat phrases.  Hearing is intact to conversational tone.   Sensation: Sensation is intact to light touch throughout Motor: Strength is at least antigravity x4. DTR's 2/4 in UE/LE     Movement examination: Tone: There is normal tone in the UE/LE Abnormal movements:  no tremor.  No myoclonus.  No asterixis.   Coordination:  There is no decremation with RAM's. Normal finger to nose  Gait and Station: The patient has no difficulty arising out of a deep-seated chair without the use of the hands.  She needs a right cane for stability.  The patient's stride length is good.  Gait is cautious and narrow.    Thank you for allowing  us  the opportunity to participate in the care of this nice patient. Please do not hesitate to contact us  for any questions or concerns.   Total time spent on today's visit was 34 minutes dedicated to this patient today, preparing to see patient, examining the patient, ordering tests and/or medications and counseling the patient, documenting clinical information in the EHR or other health record, independently interpreting results and communicating  results to the patient/family, discussing treatment and goals, answering patient's questions and coordinating care.  Cc:  [facility], [place] [facility] 06/14/2023 11:29 AM

## 2023-08-21 NOTE — Progress Notes (Addendum)
 SUBJECTIVE   Patient ID: Maria Clay is a 83 y.o. (DOB March 12, 1940) female  Chief Complaint  Patient presents with   Medicare Annual Wellness Visit Subsequent    Daughter is going to schedule pt an eye exam.   Mild neurocognitive disorder   Hypertension   UTI symptoms   Diarrhea    History of Present Illness The patient presents for evaluation of diarrhea, nausea, and depression.  She has been experiencing symptoms of a urinary tract infection (UTI) and gastrointestinal issues, including diarrhea and loss of appetite, for the past week. The frequency of her diarrhea is approximately 2 to 3 times a day, with no presence of blood. She does not experience abdominal cramping but did report nausea recently. She has not vomited. Her bowel movements are generally normal, although she has had more instances of diarrhea in the past week. She reports no urgency to defecate. She has a history of gastroparesis and reflux and has been dealing with digestive issues for some time. She believes her current symptoms may be due to nervousness rather than a physical ailment. She also mentions that she does not consume enough water. She is currently taking omeprazole, which seems to be effective.  She has been feeling depressed and unmotivated, preferring to stay at home and read. She is considering seeking help from an agency that can assist with meal preparation and medication reminders. She is not interested in taking any medication for her mood. She plans to resume bowling when the fall season starts and enjoys playing cards on Saturdays.     OBJECTIVE   Allergies[1]  Current Medications[2]  Problem List[3]  Medical History[4]    Surgical History[5]  Family History[6]  BP 126/67   Pulse 88   Ht 1.6 m (5' 3)   Wt 70.1 kg (154 lb 9.6 oz)   SpO2 95%   BMI 27.39 kg/m   Review of Systems Systemic: Feeling fine.  No fever  and no chills. Head: No headache. Eyes: No vision  problems. Cardiovascular: No chest pain or discomfort. No palpitations Pulmonary: No dyspnea. No cough. Gastrointestinal: No abdominal pain. No nausea, vomiting, or diarrhea. Psychological: endorses anxiety and/or depression. All other symptoms are reviewed and are negative.  Physical Exam Constitutional: Oriented to person, place and time. Appears well-developed and well nourished. HEENT: Normocephalic, atraumatic.  EOMI, PERRL Neck: No cervical adenopathy. No thyromegaly.  Cardiovascular: Normal rate and rhythm.  Exam reveals no gallop and no friction rub.  No murmur heard.  Pulmonary/chest: Effort normal and breath sounds normal. No wheezes. No rales.  ABD: Abdomen firm, hypoactive bowel sounds Skin: Warm and dry. No rashes noted. Patient is not diaphoretic. Extremities: No edema bilaterally. No clubbing/cyanosis. Neuro:  CN grossly intact Psychiatric: Normal mood and affect. Behavior normal. Judgement and thought content normal.    ASSESSMENT/PLAN   1. Medicare annual wellness visit, subsequent  CBC with Differential   Comprehensive Metabolic Panel   Lipid Panel   TSH   Urinalysis with Reflex to Microscopic   Urinalysis with Reflex to Microscopic   Urinalysis with Reflex to Microscopic   CANCELED: Urinalysis with Reflex to Microscopic   CANCELED: Urinalysis with Reflex to Microscopic    2. High risk medication use  Magnesium    3. Type 2 diabetes mellitus with autonomic neuropathy, unspecified whether long term insulin  use    (CMD)  Albumin, Random Urine   Albumin, Random Urine   CANCELED: Albumin, Random Urine   CANCELED: Albumin, Random Urine  4. Nausea  ondansetron  (ZOFRAN ) 4 mg tablet    5. Situational mixed anxiety and depressive disorder  sertraline (ZOLOFT) 25 mg tablet    6. Dysuria  CANCELED: Urinalysis with Reflex to Microscopic      Assessment & Plan Medicare Annual Wellness Visit - Your annual wellness visit was completed today. - Routine education  was provided, including living a healthy lifestyle, cancer screening, and vaccinations. Testing, vaccinations and referrals were discussed, and ordered, as appropriate and desired.    2. Diarrhea. - Her abdominal firmness could be indicative of a significant stool burden, which her body may be attempting to expel. This is further supported by the observation of slow bowel sounds. - Given her diagnosis of gastroparesis, it is expected that her gastrointestinal motility would be slower than average. - A prescription for Zofran  was provided to manage her nausea. - She was also recommended to take MiraLAX  daily, which can be added to her morning coffee. If this does not result in increased stool production, the addition of Colace was suggested.  3. Depression. - She expressed feelings of depression and lack of motivation. - A prescription for Zoloft was provided to help manage her depressive symptoms. - Counseling was provided regarding the benefits of engaging in activities such as bowling and playing cards. - Encouragement to maintain social interactions and consider the medication to improve mood.  4. Diabetes Mellitus. - Her diabetes is well-controlled with her current insulin  regimen. - No changes to her diabetes management were deemed necessary at this time. - Continued monitoring of blood glucose levels was advised. - Discussion about maintaining a balanced diet to support overall health.    Return in about 3 months (around 11/21/2023). For chronic follow up (Chol, DM, HTN, Memory)  Orders Placed This Encounter  Procedures   CBC with Differential   Comprehensive Metabolic Panel   Lipid Panel   TSH   Magnesium   Urinalysis with Reflex to Microscopic   Albumin, Random Urine   Urinalysis with Reflex to Microscopic    Requested Prescriptions   Signed Prescriptions Disp Refills   ondansetron  (ZOFRAN ) 4 mg tablet 20 tablet 0    Sig: Take 1 tablet (4 mg total) by mouth  every 8 (eight) hours as needed for nausea or vomiting.   sertraline (ZOLOFT) 25 mg tablet 90 tablet 0    Sig: Take 1 tablet (25 mg total) by mouth daily.    Risks, benefits, and alternatives of the medication(s) and treatment plan(s) were discussed, and she expressed understanding. Plan follow up as discussed or as needed. No barriers to treatment identified in this visit.      [facility] Medicare Wellness Visit Type:: Subsequent Annual Wellness Visit  Name: NITZA SCHMID Date of Birth: Jan 23, 1940 Age: 83 y.o. MRN: [id number] Visit Date: 08/21/2023  History obtained from: patient  Living Arrangements/Support System/Health Assessment/Pain/Stress Marital status: divorced Number of children: 3 Occupation: Retired Living arrangements: lives with family (Niece moved in) Does the patient have a support system (family, friend, church, conservation officer, nature, etc)?: Yes   Do you have any dental concerns?: No In the past month, have you experienced a change in your bladder control?: (!) Yes   Do you have any difficulty obtaining your medications?: No   Do you have trouble consistently taking or remembering to take all of your medications as prescribed?: No Patient rates overall stress level as: None Does stress affect daily life?: No Typical amount of pain:  none Does pain affect daily life?: No Are you currently prescribed opioids?: No                Depression Screening  Behavioral Health Screening  Patient Health Questionnaire-2 Score: 2 (08/21/2023  1:44 PM)  Patient Health Questionnaire-9 Score: 3 (08/21/2023  1:44 PM)   PHQ Question # 9 Thoughts that you would be better off dead or hurting yourself in some way: Not at all   Patient's Depression screening/score = Negative        Social History (Tobacco/Drugs/Sexual Activity) Lita reports that she quit smoking about 29 years ago. Her smoking use included cigarettes. She has  never been exposed to tobacco smoke. She has never used smokeless tobacco. Tobacco Use?: No How many times in the past year have you used a recreational drug or used a prescription medication for nonmedical reasons?: None Risk factors for sexually transmitted infections (i.e., multiple sexual partners): No Are you bothered by sexual problems?: No  Alcohol Screening How often do you have a drink containing alcohol?: Never How many standard drinks containing alcohol do you have on a typical day?: Never, 1 or 2 drinks How often do you have six or more drinks on one occasion?: Never Audit-C Score: 0  Physical Activity Regular exercise?: (!) No      Diet How many meals a day?: 2 Eats fruit and vegetables daily?: Yes (Sometimes) Most meals are obtained by: preparing their own meals, eating out  Home and Transportation Safety All rugs have non-skid backing?: Yes All stairs or steps have railings?: N/A, no stairs Grab bars in the bathtub or shower?: Yes Have non-skid surface in bathtub or shower?: Yes Good home lighting?: Yes Regular seat belt use?: Yes      Activities of Daily Living Feed self?: Yes (Has to be reminded) Bathe self?: Yes Dress self?: Yes Use toilet without assistance?: Yes Walk without assistance?: Yes    Instrumental Activities of Daily Living Manage finances?: (!) No Shop for themselves?: (!) No Prepare meals?: Yes (Sometimes) Use the telephone?: Yes Manage medications?: (!) No Medications managed by: Family Performs basic housework/laundry?: (!) No (Has a maid) Drives?: Yes (Sometimes) Primary transportation is: driving, family or friends  Hearing Concerns about hearing?: No Uses hearing aids?: No Hear whispered voice? (Observed): Yes  Vision Concerns about vision?: No    Fall Risk Is the patient ambulatory?: Yes One or more falls in the last year:: No Feels unsteady when walking:: No  Cognitive Assessment Has a diagnosis of dementia or  cognitive impairment?: Yes                Advance Directives Living will?: Yes Advance directive information provided to patient: N/A Healthcare POA?: Yes Name of Health Care Agent: Dorothyann Mirza Relationship to patient: Adult child Health Care Agent's phone number: [phone]         Other History I reviewed and updated the following risk factors and conditions as appropriate: Reviewed/Updated: Problem List, Medical History, Surgical History, Family History, Medications, Allergies Reviewed/Updated: Vital Signs (height, weight, and BP), Immunizations, Health Maintenance Patient Care Team Updated: Done  Vital Signs BP 126/67   Pulse 88   Ht 1.6 m (5' 3)   Wt 70.1 kg (154 lb 9.6 oz)   SpO2 95%   BMI 27.39 kg/m   Screening and Immunizations Health Maintenance Status       Date Due Completion Dates   Diabetes:  Quantitative uACR for Kidney Evaluation Never done ---   Diabetes:  Foot Exam 07/04/2022 07/03/2021 (Done)   Comment on 07/03/2021: See [facility]   Influenza Vaccine (1) 07/07/2024 (Originally 08/09/2023) 01/08/2023, 10/10/2021   COVID-19 Vaccine (7 - 2024-25 season) 08/19/2024 (Originally 09/09/2022) 10/10/2021, 11/24/2020   Adult RSV (60+ Years or Pregnancy) (1 - 1-dose 75+ series) 01/07/2025 (Originally 05/18/2017) ---   DTaP/Tdap/Td Vaccines (2 - Tdap) 01/07/2025 (Originally 07/04/2021) 07/05/2011, 07/05/2011   Depression Monitoring 01/24/2024 07/24/2023   Diabetes:  eGFR for Kidney Evaluation 01/30/2024 01/30/2023, 07/14/2021   Diabetes: Hemoglobin A1C 07/23/2024 07/24/2023, 07/24/2023   Comprehensive Annual Visit 08/20/2024 08/21/2023, 08/21/2023   Medicare Annual Wellness (AWV) Subsequent Visits 08/20/2024 08/21/2023, 08/21/2023       Immunization History  Administered Date(s) Administered   DTaP vaccine(INFANRIX)6W-6Y 07/05/2011   Influenza, High-dose Seasonal, Quadrivalent, Preservative Free 09/07/2014, 09/20/2020, 10/10/2021   Influenza, Injectable,  Quadrivalent, Preservative Free 09/17/2017, 09/08/2018   Influenza, Injectable, Quadrivalent, With Preservative 09/28/2016   Influenza, Unspecified 10/24/2005, 10/30/2006, 09/26/2007, 09/30/2008, 10/26/2009, 10/16/2010, 10/16/2011, 08/28/2012, 09/08/2013, 10/29/2015   Influenza, high-dose, trivalent, PF 10/16/2011, 08/28/2012, 09/07/2014, 09/09/2019, 10/10/2021, 01/08/2023   Moderna Covid-19, mRNA,LNP-S,PF 12+ Yrs 10/10/2021   Pfizer SARS-CoV-2 Bivalent 12+ yrs 11/24/2020   Pfizer SARS-CoV-2 Primary Series 12+ yrs 03/02/2019, 03/23/2019, 10/25/2019, 04/19/2020   Pneumococcal Conjugate 13-Valent 03/17/2015   Pneumococcal Conjugate Vaccine 20-Valent (PREVNAR-20) 6 wks+ 10/16/2021   Pneumococcal Polysaccharide Vaccine, 23 Valent (PNEUMOVAX-23) 2Y+ 07/05/2011, 09/28/2016   TD vaccine (ADULT)PF(TENIVAC) 7Y+ 07/05/2011   Varicella Zoster (SHINGRIX) 18Y+ 11/08/2020, 01/24/2021   Zoster, Live 09/27/2010    Assessment/Plan: Subsequent Annual Wellness Visit: The topics above were reviewed with the patient.  Healthy lifestyle principles reviewed.  Recommendations provided when indicated.  Follow up 1 year for next wellness visit.  Orders Placed This Encounter  Procedures   CBC with Differential   Comprehensive Metabolic Panel   Lipid Panel   TSH   Magnesium   Urinalysis with Reflex to Microscopic   Albumin, Random Urine   Urinalysis with Reflex to Microscopic    New Medications Ordered This Visit  Medications   ondansetron  (ZOFRAN ) 4 mg tablet    Sig: Take 1 tablet (4 mg total) by mouth every 8 (eight) hours as needed for nausea or vomiting.    Dispense:  20 tablet    Refill:  0   sertraline (ZOLOFT) 25 mg tablet    Sig: Take 1 tablet (25 mg total) by mouth daily.    Dispense:  90 tablet    Refill:  0    Patient Care Team: Tully Shawnee Gladis Sharl, PA-C as PCP - General (Physician Assistant) Tully Shawnee Gladis Sharl, PA-C as PCP - Attributed  Electronically  signed by: Tully Shawnee Gladis Sharl, PA-C 08/21/2023 3:24 PM      [1] Allergies Allergen Reactions   Codeine GI Intolerance   Donepezil Mental Status Changes   Tramadol Itching and GI Intolerance  [2] Current Outpatient Medications  Medication Sig Dispense Refill   albuterol HFA (PROVENTIL HFA;VENTOLIN HFA;PROAIR HFA) 90 mcg/actuation inhaler Inhale 2 puffs every 6 (six) hours as needed for wheezing or shortness of breath.     blood-glucose meter misc Dispense 1 onetouch verio flex glucometer 1 each 0   glucose blood (OneTouch Verio test strips) test strip Use as instructed to check BG 3 times/day.  Dx E11.65 100 each 5   lisinopriL -hydroCHLOROthiazide  (PRINZIDE ) 20-12.5 mg per tablet Take 1 tablet by mouth Once Daily. 90 tablet 0   omeprazole (PriLOSEC) 40 mg DR capsule TAKE 1 CAPSULE BY MOUTH TWICE DAILY 30-60  MINUTES  BEFORE  AM/PM  MEALS 180 capsule 3   oxyBUTYnin (DITROPAN XL) 10 mg 24 hr tablet Take 10 mg by mouth daily.     pen needle, diabetic 32 gauge x 5/32 ndle Use as needed for 1 injection daily 100 each 3   rivastigmine  (EXELON ) 4.5 mg capsule Take 1 capsule (4.5 mg total) by mouth 2 (two) times a day. 180 capsule 1   Xultophy 100/3.6 100 unit-3.6 mg /mL (3 mL) pen Inject 24 units once a day with additional as needed. MDD: 50 units/day 45 mL 2   ondansetron  (ZOFRAN ) 4 mg tablet Take 1 tablet (4 mg total) by mouth every 8 (eight) hours as needed for nausea or vomiting. 20 tablet 0   rosuvastatin  (CRESTOR ) 10 mg tablet Take 10 mg by mouth Once Daily. (Patient not taking: Reported on 08/21/2023) 30 tablet 0   sertraline (ZOLOFT) 25 mg tablet Take 1 tablet (25 mg total) by mouth daily. 90 tablet 0   No current facility-administered medications for this visit.  [3] Patient Active Problem List Diagnosis   Mixed hyperlipidemia   Type 2 diabetes mellitus with diabetic autonomic neuropathy, with long-term current use of insulin  (HCC)   COPD, severe    (CMD)    Asymptomatic premature ventricular contractions   Benign essential hypertension   Anemia   Allergic rhinitis   Obesity   Gastroesophageal reflux disease without esophagitis   Restless leg syndrome   IBS (irritable bowel syndrome)   Submucosal nodules   Hiatal hernia   Restrictive lung disease   Esophageal dysphagia   Age-related osteoporosis without current pathological fracture   Chronic gastric ulcer   Cobalamin deficiency   Esophageal stricture   OAB (overactive bladder)   IDA (iron deficiency anemia)   Mitral valve disorder   Osteoarthritis   Other specified cardiac dysrhythmias   Combined forms of age-related cataract of both eyes   Generalized anxiety disorder with panic attacks   Spondylolisthesis at L4-L5 level   Lumbar spondylosis   Trochanteric bursitis of left hip   Nontraumatic complete tear of left rotator cuff   Agoraphobia   Abdominal distention   Diastasis recti   Gastroparesis   Ptosis of eyelid, bilateral   Neck pain   S/P cervical spinal fusion   Pain of cervical facet joint   Hammertoe of right foot   Callus of foot   Memory loss   Acute non-recurrent pansinusitis   Current moderate episode of major depressive disorder without prior episode (HCC)   Upper GI bleed   Sepsis (HCC)   Sigmoid diverticulitis   Mild neurocognitive disorder   At risk for sleep apnea   Closed fracture of lateral malleolus of left ankle   Ankle fracture, left, closed, with routine healing, subsequent encounter   Acute left ankle pain   Long-term insulin  use    (CMD)   Cancer of lower lobe of right lung    (CMD)   Submucosal lesion of stomach   Colitis   Cough   Diabetes mellitus without complication    (CMD)   Fatigue   History of diverticulitis   Hyponatremia   Major depressive disorder   Mild cognitive impairment   Mild dementia (CMD)   Nausea vomiting and diarrhea   Primary localized osteoarthrosis,  lower leg   Recurrent major depressive episodes   Reflux gastritis  [4] Past Medical History: Diagnosis Date   Abdominal distention 08/14/2018   Acute non-recurrent pansinusitis 03/07/2021   Age-related osteoporosis without current pathological fracture  07/18/2013   Agoraphobia without panic 09/17/2017   Chronic, intermittent - patient on low dose klonopin as needed - does not take daily - discussed risks and benefits of med - will give daily prn and see how long this lasts - no refill needed today - patient compliant - Reviewed [place] controlled substances registry   Anemia 07/27/2011   Asymptomatic premature ventricular contractions 07/27/2011   Benign neoplasm of colon    Callus of foot 06/21/2020   Cardiac conduction disorder 07/18/2013   Chronic gastric ulcer    Cobalamin deficiency    Current moderate episode of major depressive disorder without prior episode (CMD) 07/14/2021   Diastasis recti 08/14/2018   Esophageal dysphagia 05/24/2016   Esophageal stricture    Gastroesophageal reflux disease without esophagitis 05/04/2015   Gastroparesis 08/14/2018   Generalized anxiety disorder with panic attacks 01/25/2017   Hammertoe of right foot 06/21/2020   Hiatal hernia 11/25/2015   History of adenomatous polyp of colon    Hypertension with goal to be determined    Hypertonicity of bladder    IBS (irritable bowel syndrome) 05/04/2015   IDA (iron deficiency anemia) 07/18/2013   Localized primary osteoarthritis of wrist    Lower GI bleed 06/17/2021   Lumbar spondylosis 04/30/2017   Memory loss 12/12/2020   Mitral valve disorder 07/18/2013   Mixed hyperlipidemia 07/27/2011   Neck pain 01/14/2020   Nontraumatic complete tear of left rotator cuff 09/02/2017   Obesity 05/04/2015   Osteoarthritis 07/18/2013   Other and unspecified hyperlipidemia    Pain of cervical facet joint 01/14/2020   Premature ventricular contractions    Primary hypertension 07/27/2011   Primary  osteoarthritis of right knee    Restless leg syndrome 05/04/2015   Restrictive lung disease 11/25/2015   Sepsis    (CMD) 06/17/2021   Severe chronic obstructive pulmonary disease    (CMD) 05/04/2015   Sigmoid diverticulitis 06/16/2021   Spondylolisthesis at L4-L5 level 04/30/2017   Type 2 diabetes mellitus with diabetic autonomic neuropathy, with long-term current use of insulin     (CMD) 05/02/2015   Type II diabetes mellitus    (CMD)    or unspec not stated as uncontrolled   Unspecified essential hypertension   [5] Past Surgical History: Procedure Laterality Date   CARDIAC CATHETERIZATION     Procedure: CARDIAC CATHETERIZATION   CARDIAC ELECTROPHYSIOLOGY MAPPING AND ABLATION     Procedure: CARDIAC ELECTROPHYSIOLOGY MAPPING AND ABLATION   CERVICAL FUSION  2011   Procedure: CERVICAL FUSION; C5-C7 ACDF Dr. Victory Gunnels   COLONOSCOPY     EYE SURGERY     Procedure: EYE SURGERY; laser   HYSTERECTOMY      Procedure: HYSTERECTOMY   INCONTINENCE SURGERY     Procedure: INCONTINENCE SURGERY   SHOULDER ARTHROSCOPY W/ ROTATOR CUFF REPAIR Left 01/12/2019   Procedure: SHOULDER ARTHROSCOPY W/ ROTATOR CUFF REPAIR, EXTENSIVE DEBRIDEMENT, BICEPS TENOTOMY;  Surgeon: Jordan Miller Case, MD;  Location: [facility];  Service: Orthopedics;  Laterality: Left;   TONSILLECTOMY     Procedure: TONSILLECTOMY   WRIST ARTHROSCOPY Bilateral    Procedure: WRIST ARTHROSCOPY  [6] Family History Problem Relation Name Age of Onset   Diabetes Mother Rollene Ditch    Unexplained death Mother Rollene Ditch    Dementia Mother Rollene Ditch    Heart disease Father Carlin Ditch    Lung cancer Father Carlin Ditch    Hypertension Father Carlin Ditch    Breast cancer Neg Hx

## 2023-10-09 NOTE — Progress Notes (Signed)
" °  Subjective Patient ID: Maria Clay is a 83 y.o. female.  Diabetes  Maria Clay returns for f/u of Type II DM with Neuropathy.  Has been fair since her previous visit in July 2025.  Accompanied today by her daughter.  Still having issues, at times, with getting the Xultophy.   Diabetes:  Reports taking Xultophy at 24 units qAM.   Stopped 70/30 insulin  when we started Soliqua.  Has been on oral DM medications in the past.   Denies recent hypoglycemia.    A1C was 7.3% in July 2025, too soon to update today.   Review of Systems  All other systems reviewed and are negative.   Objective Physical Exam Vitals reviewed.  Constitutional:      Appearance: Normal appearance.  Eyes:     Pupils: Pupils are equal, round, and reactive to light.  Cardiovascular:     Rate and Rhythm: Normal rate and regular rhythm.  Pulmonary:     Effort: Pulmonary effort is normal.     Breath sounds: Normal breath sounds.  Musculoskeletal:     Cervical back: Normal range of motion and neck supple.  Skin:    General: Skin is warm and dry.  Neurological:     Mental Status: She is alert and oriented to person, place, and time.  Psychiatric:        Mood and Affect: Mood normal.        Behavior: Behavior normal.    Assessment/Plan  1. Type 2 diabetes mellitus with diabetic autonomic neuropathy, with long-term current use of insulin  (HCC) - POC Hemoglobin A1c - POC Glucose (Hemocue/NOVA)  2. Long term insulin  use (HCC)   Plan  Diabetes:  A1C is at goal of < 8.0%, established for her age.  Continue Xultophy at 24 units qAM.  Reviewed she could use Soliqua, in place of Xultophy, if pharmacy has that instead.  We discussed using a CGM previously, happy just doing fingersticks.  Try to check BG a least once a day and bring meter to future appointments.  Call if she has problems with hypoglycemia before her next visit.    Neuropathy:  Inspect feet daily for signs of infection and/or skin  breakdown.  Follow with Podiatry as instructed.     Electronically signed: Lionel Reggy Molt, PA-C 10/09/2023  12:07 PM   "

## 2024-01-19 NOTE — Progress Notes (Incomplete)
 "   Dementia without behavioral disturbance, concern for Alzheimer's disease   Maria Clay is a very pleasant 84 y.o. RH female with a history of hypertension, hyperlipidemia, DM2 with neuropathy and retinopathy, COPD, right lung cancer, PAD, depression, IBS, OAB, depression, and a diagnosis of mild dementia of unclear etiology, but with concern for Alzheimer's disease early stages per neuropsych evaluation on 10/15/2022 seen today in follow up for memory loss. Patient is currently on rivastigmine  4.5 mg twice daily as per PCP***.   Patient was last seen on 06/14/2023***. Memory is ***. MMSE today is  /30. Patient is able to participate on ADLs and continues to drive very short distances without difficulties. Mood is good*** . This patient is accompanied in the office by her daughter who supplements the history.  Previous records as well as any outside records available were reviewed prior to todays visit.   Follow up in  months Continue rivastigmine  4.5 mg twice daily as per PCP, side effects discussed Continue to replenish B12 and follow-up with PCP Recommend good control of cardiovascular risk factors.   Continue to control mood as per PCP, she is on Zoloft Monitor driving   Discussed the use of AI scribe software for clinical note transcription with the patient, who gave verbal consent to proceed.  History of Present Illness    Any changes in memory since last visit? Depends on the day, and days may be better than others-daughter says.  She continues to have difficulty with STM, LTM is good.  She likes to read extensively as before, she does not enjoy doing brain stimulating exercises. repeats oneself?  Endorsed, a little bit more. Disoriented when walking into a room? Denies     Leaving objects?  May misplace things but not in unusual places   Wandering behavior?  denies   Any personality changes since last visit?  Denies.  During her hospitalization for her esophagitis she had moments  of mood changes, but these resolved. Any worsening depression?:  Denies.   Hallucinations or paranoia?  Denies.   Seizures? denies    Any sleep changes?  Sleeps well.  Denies vivid dreams, REM behavior or sleepwalking   Sleep apnea?   Denies.   Any hygiene concerns? Denies.  Independent of bathing and dressing?  Endorsed  Does the patient needs help with medications?  Daughter is in charge Who is in charge of the finances?  Son is in charge Any changes in appetite?  She does not eat  very well especially recently when she was admitted with esophagitis.  She does not drink protein drinks. (See below). She admits not drinking enough water. Patient have trouble swallowing?  She had a recent presentation to the hospital 06/08/2023 for esophagitis requiring IV PPI with improvement of symptoms.  No further nausea or vomiting Does the patient cook? No  Any headaches?   Denies.   Any vision changes? Denies  Chronic back pain  denies.   Ambulates with difficulty?  She uses a cane to ambulate for stability.  Recent falls or head injuries? Denies.     Unilateral weakness, numbness or tingling?  She has a history of diabetic neuropathy  any tremors?  Denies   Any anosmia?  Denies   Any incontinence of urine?  Endorsed, due to OAB, wears diapers.  She takes Information Systems [employer].  She has recurrent UTIs*** Any bowel dysfunction?   Denies      Patient lives alone, her niece is moving in with her soon.  Does the patient drive?  Continues to drive, denies any new issues.  Her daughter did place 17  on her phone and she noticed that a couple of times she turned around and went back home, she wonders if the patient became lost and returned to the house.   Initial visit 08/07/2022 How long did patient have memory difficulties?  Gradually , for at least 3 years . Patient has difficulty remembering recent conversations and people names, new information. She began taking donepezil less than 1 y ago but  the memory was  worsening with the medicine. This was changed to rivastigmine , and it seems that memory is not getting worse. She likes to read extensively Has not been doing brain games repeats oneself?  Endorsed Disoriented when walking into a room?  Denies   Leaving objects in unusual places?   denies   Wandering behavior? denies   Any personality changes ? denies   Any history of depression?: denies   Hallucinations or paranoia?  denies   Seizures? denies    Any sleep changes?  No sleep changes. Denies  vivid dreams, REM behavior or sleepwalking   Sleep apnea? denies   Any hygiene concerns?  denies   Independent of bathing and dressing?  Endorsed  Does the patient need help with medications? Daughter is in charge for the last 3 months because she may have forgotten doses.  Who is in charge of the finances? Daughter and son  is in charge     Any changes in appetite?  I don't know what I ate or if I ate. She admits to not drinking enough water  Patient have trouble swallowing?  denies   Does the patient cook?  Yes, denies accidents Any headaches?  denies   Chronic back pain?  denies   Ambulates with difficulty? Needs  a cane to ambulate    Recent falls or head injuries? In April 2024 she had a mechanical fall with the carpet. She had ankle fracture , now fully recovered   Vision changes? No  Stroke like symptoms?  denies   Any tremors?  denies   Any anosmia?  denies   Any incontinence of urine? Endorsed due to OAB , uses Depends   Any bowel dysfunction? denies      Patient [place] History of heavy alcohol intake? denies   History of heavy tobacco use? denies   Family history of dementia?  Mo had dementia ? Type  Does patient drive? 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       Neuropsych evaluation 10/15/2022 Briefly, results suggested severe impairment surrounding both encoding (i.e., learning) and delayed retrieval aspects of memory. Additional impairments were exhibited across cognitive flexibility and clock drawing, while relative weaknesses were exhibited across semantic fluency and recognition/consolidation aspects of memory. Further performance variability was exhibited across basic attention. The etiology for her somewhat mild dementia presentation is unclear at the present time. With that being said, I do feel that concerns for an underlying neurodegenerative illness, namely Alzheimer's disease, are reasonable. Ms. Bolick did not benefit from repeated exposure to novel information and was fully amnestic (i.e., 0% retention) across 2/3 memory tasks. She also performed quite poorly across 2/3 yes/no recognition trials. Taken together, this suggests evidence  for rapid forgetting and an evolving storage impairment, both of which are the hallmark testing patterns of this illness. Further weakness surrounding semantic fluency, cognitive flexibility, and clock drawing would follow fairly typical disease progression, escalating concerns. Strong confrontation naming performances are certainly encouraging. While I cannot rule in Alzheimer's disease with strong confidence, this illness certainly cannot be ruled out and there is concern for its presence in earlier stages.     PREVIOUS MEDICATIONS: Donepezil (itching and nausea)           06/14/2023   12:00 PM  MMSE - Mini Mental State Exam  Orientation to time 1  Orientation to Place 1  Registration 3  Attention/ Calculation 5  Recall 0  Language- name 2 objects 2  Language- repeat 1  Language- follow 3 step command 3  Language- read & follow direction 1  Write a sentence 1  Copy design 0  Total score 18      08/07/2022    8:00 PM  Montreal Cognitive Assessment   Visuospatial/ Executive (0/5) 2   Naming (0/3) 3  Attention: Read list of digits (0/2) 2  Attention: Read list of letters (0/1) 1  Attention: Serial 7 subtraction starting at 100 (0/3) 1  Language: Repeat phrase (0/2) 1  Language : Fluency (0/1) 1  Abstraction (0/2) 2  Delayed Recall (0/5) 0  Orientation (0/6) 4  Total 17  Adjusted Score (based on education) 18      Objective:    Neurological Exam:    VITALS:  There were no vitals filed for this visit.  GEN:  The patient appears stated age and is in NAD. HEENT:  Normocephalic, atraumatic.   Neurological examination:  General: NAD, well-groomed, appears stated age. Orientation: The patient is alert. Oriented to person, place and not to date Cranial nerves: There is good facial symmetry.The speech is fluent and clear. No aphasia or dysarthria. Fund of knowledge is appropriate. Recent and remote memory are impaired. Attention and concentration are reduced. Able to name objects and repeat phrases.  Hearing is intact to conversational tone. *** Sensation: Sensation is intact to light touch throughout Motor: Strength is at least antigravity x4. DTR's 2/4 in UE/LE     Movement examination:  Tone: There is normal tone in the UE/LE Abnormal movements:  no tremor.  No myoclonus.  No asterixis.   Coordination:  There is no decremation with RAM's. Normal finger to nose  Gait and Station: The patient has some difficulty arising out of a deep-seated chair without the use of the hands.  She needs a right cane for stability.  The patient's stride length is good.  Gait is cautious and narrow.    Thank you for allowing us  the opportunity to participate in the care of this nice patient. Please do not hesitate to contact us  for any questions or concerns.   Total time spent on today's visit was *** minutes dedicated to this patient today, preparing to see patient, examining the patient, ordering tests and/or medications and counseling the patient, documenting clinical  information in the EHR or other health record, independently interpreting results and communicating results to the patient/family, discussing treatment and goals, answering patient's questions and coordinating care.  Cc:  [facility], [place] [facility] 01/19/2024 11:10 AM      "

## 2024-01-20 ENCOUNTER — Ambulatory Visit: Payer: Self-pay | Admitting: Physician Assistant

## 2024-01-20 ENCOUNTER — Encounter: Payer: Self-pay | Admitting: Physician Assistant

## 2024-03-04 ENCOUNTER — Ambulatory Visit: Payer: Self-pay | Admitting: Physician Assistant

## 2024-03-11 ENCOUNTER — Ambulatory Visit: Payer: Self-pay | Admitting: Physician Assistant
